# Patient Record
Sex: Female | Born: 1985
Health system: Southern US, Community
[De-identification: ages and names within clinical notes are randomized; demographics above are authoritative.]

## PROBLEM LIST (undated history)

## (undated) ENCOUNTER — Ambulatory Visit: Discharge status: Absent without leave | Payer: BC Managed Care – PPO

## (undated) DIAGNOSIS — T7840XA Allergy, unspecified, initial encounter: Secondary | ICD-10-CM

## (undated) DIAGNOSIS — F99 Mental disorder, not otherwise specified: Secondary | ICD-10-CM

## (undated) DIAGNOSIS — F410 Panic disorder [episodic paroxysmal anxiety] without agoraphobia: Secondary | ICD-10-CM

## (undated) DIAGNOSIS — J45990 Exercise induced bronchospasm: Secondary | ICD-10-CM

## (undated) DIAGNOSIS — F419 Anxiety disorder, unspecified: Secondary | ICD-10-CM

## (undated) HISTORY — DX: Allergy, unspecified, initial encounter: T78.40XA

## (undated) HISTORY — DX: Mental disorder, not otherwise specified: F99

## (undated) HISTORY — DX: Anxiety disorder, unspecified: F41.9

## (undated) HISTORY — DX: Panic disorder (episodic paroxysmal anxiety): F41.0

## (undated) HISTORY — DX: Exercise induced bronchospasm: J45.990

---
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
## 2003-10-26 IMAGING — CT CT PELVIS W/O CM
1 series · 15 of 32 positions shown, 19 images · non-contrast
Comparison: none

FINDINGS
CLINICAL DATA: LEFT LOWER QUADRANT ABDOMINAL PAIN, NAUSEA.
CT ABDOMEN WITHOUT CONTRAST MEDIA
NO PRIOR STUDIES.
CONTIGUOUS AXIAL CT IMAGES WERE OBTAINED FROM THE ADRENAL GLANDS THROUGH THE ILIAC CRESTS.  NO
CONTRAST WAS ADMINISTERED.
LEFT-SIDED HYDRONEPHROSIS AND HYDROURETER EXTEND DOWN TO THE LEVEL OF AN OBSTRUCTIVE  3 MM LEFT
URETEROVESICAL JUNCTION CALCULUS.  MILD LEFT PERINEPHRIC AND PERIURETERAL STRANDING IS PRESENT.
THERE IS A 2 MM LEFT KIDNEY UPPER POLE NON-OBSTRUCTIVE CALCULUS, A 3 MM LEFT MID KIDNEY NON-
OBSTRUCTIVE CALCULUS, AND A 2 MM RIGHT MID KIDNEY NON-OBSTRUCTIVE CALCULUS.  NO RIGHT-SIDED
HYDRONEPHROSIS OR HYDROURETER IS PRESENT.  INCIDENTAL NOTE IS MADE OF A CIRCUMAORTIC LEFT RENAL
VEIN.
IMPRESSION
1.  LEFT SIDED RENAL OBSTRUCTION DUE TO LEFT URETEROVESICAL JUNCTION CALCULUS.
2.  NON-OBSTRUCTIVE BILATERAL RENAL CALCULI.
CT PELVIS WITHOUT CONTRAST
CONTIGUOUS AXIAL CT IMAGES WERE OBTAINED FROM THE ILIAC CRESTS TO THE PROXIMAL FEMURS.
LEFT HYDRONEPHROSIS EXTENDS DOWN TO A 4 MM LEFT URETEROVESICAL JUNCTION CALCULUS, WHICH IS
OBSTRUCTIVE.  NO FREE PELVIC FLUID IS IDENTIFIED.
OBSTRUCTIVE 3 MM LEFT UVJ STONE.

[Series 807: — · axial · 0.52mm/px · z∈[+1295,+1620]mm · 15 of 73 slices shown, 19 images]
[im 5/73  soft-tissue]
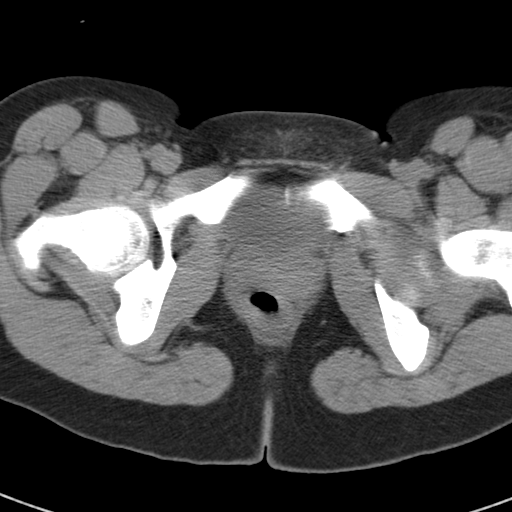
[im 5/73  bone]
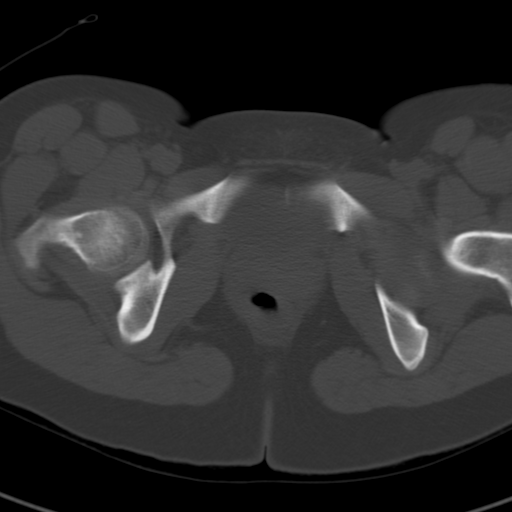
[im 10/73  soft-tissue]
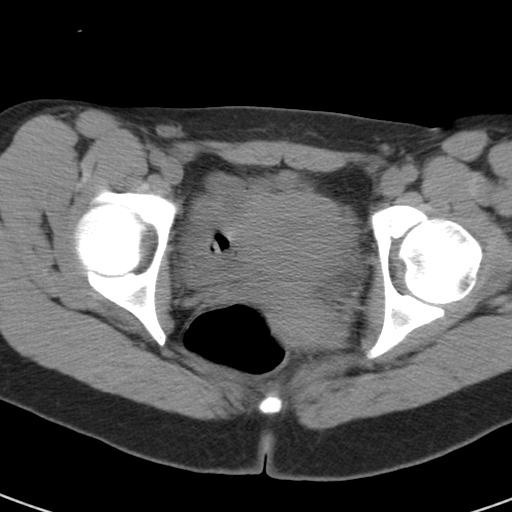
[im 14/73  soft-tissue]
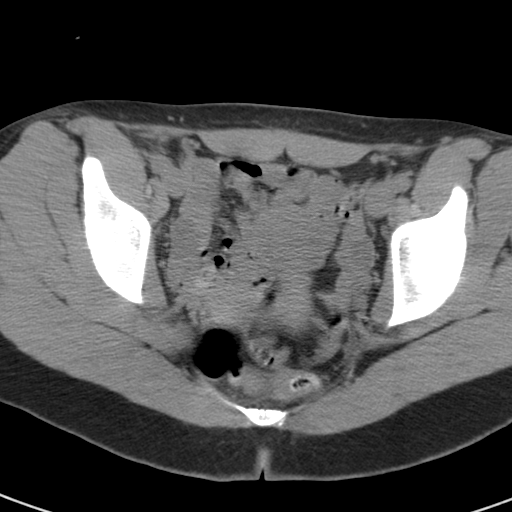
[im 21/73  soft-tissue]
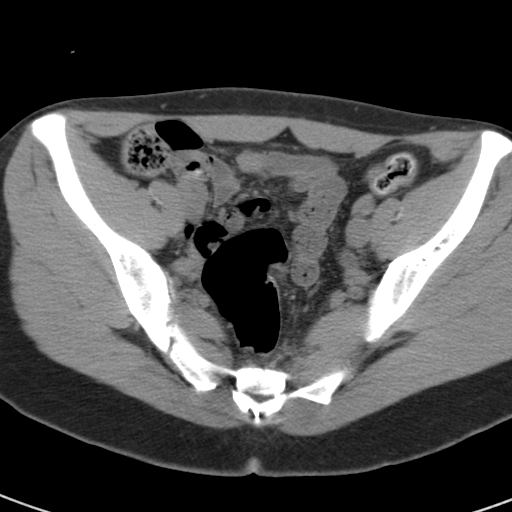
[im 26/73  soft-tissue]
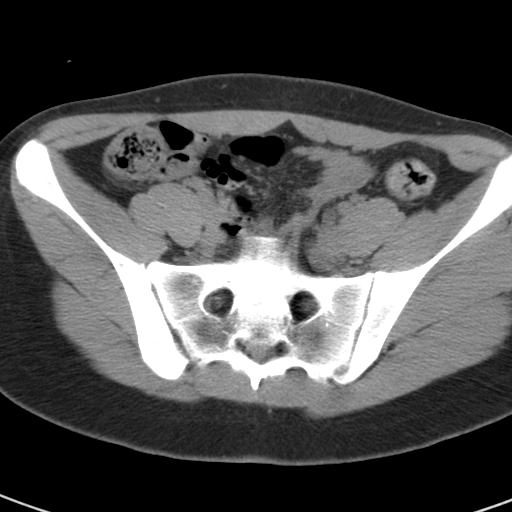
[im 31/73  soft-tissue]
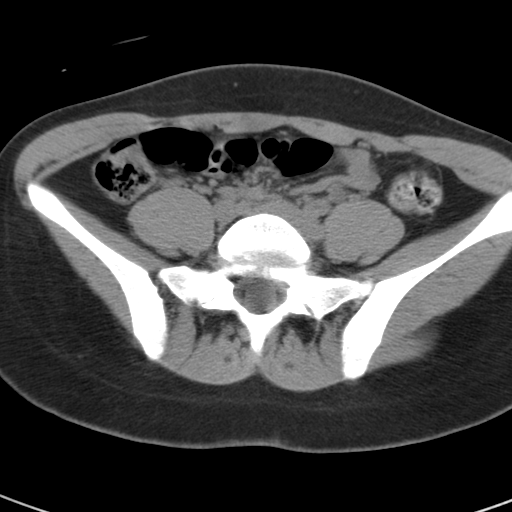
[im 38/73  soft-tissue]
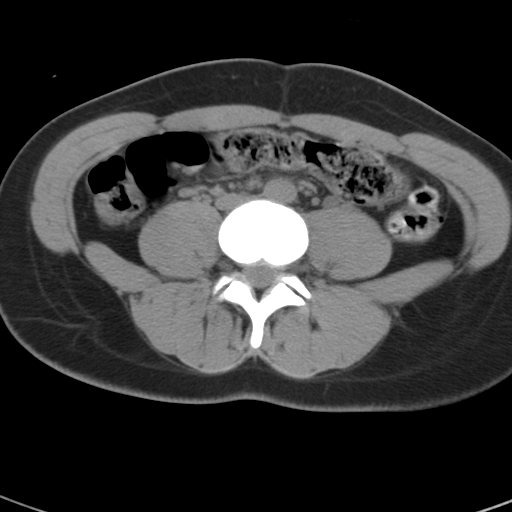
[im 42/73  soft-tissue]
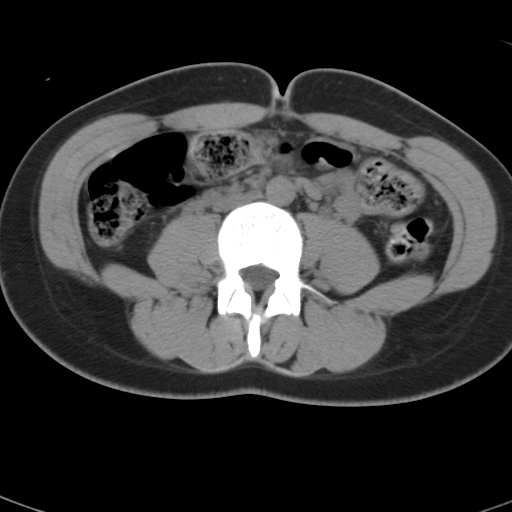
[im 47/73  soft-tissue]
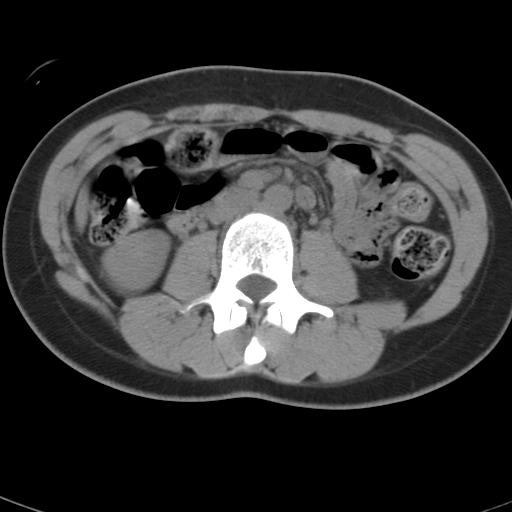
[im 47/73  bone]
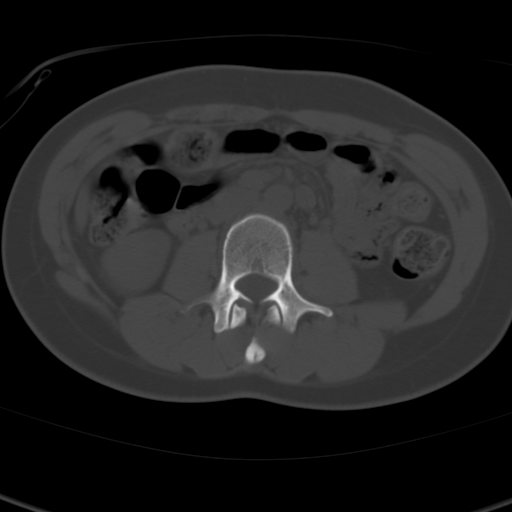
[im 52/73  soft-tissue]
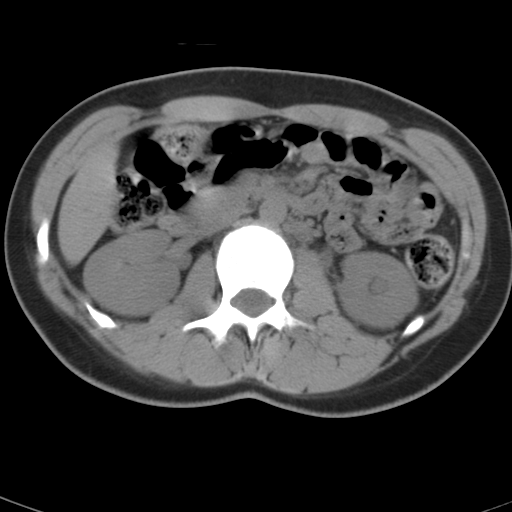
[im 59/73  soft-tissue]
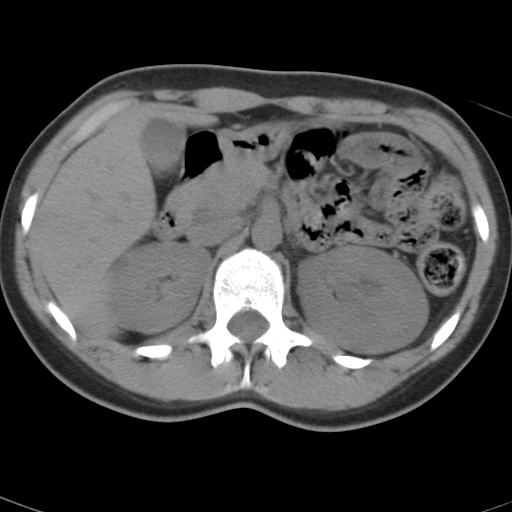
[im 63/73  soft-tissue]
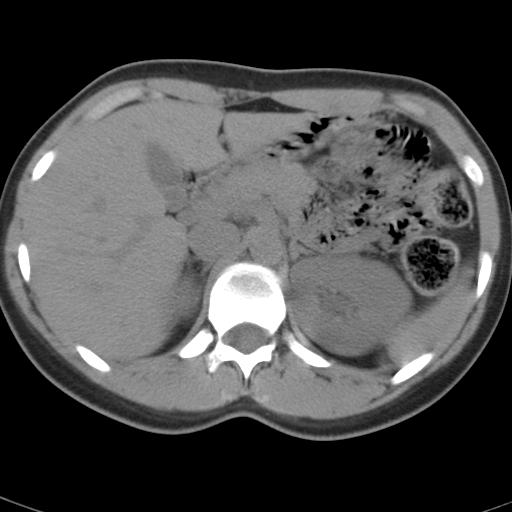
[im 63/73  lung]
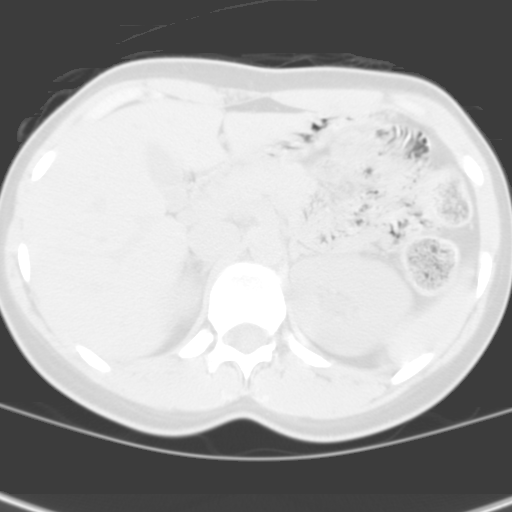
[im 66/73  lung]
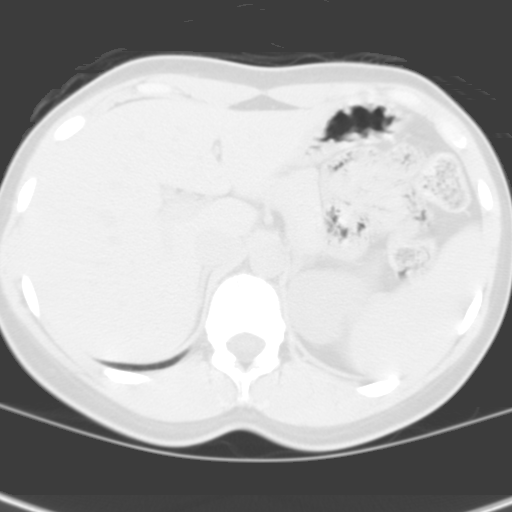
[im 68/73  soft-tissue]
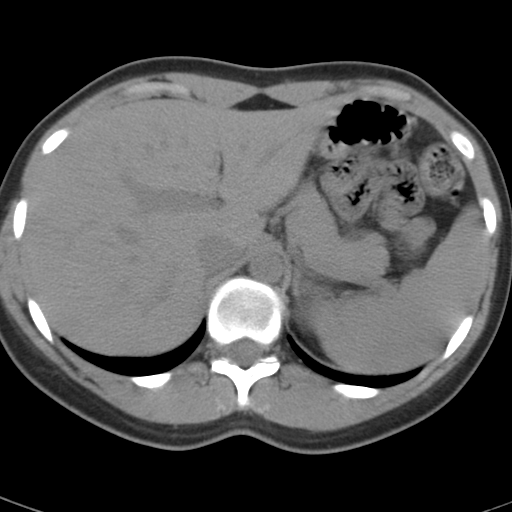
[im 68/73  lung]
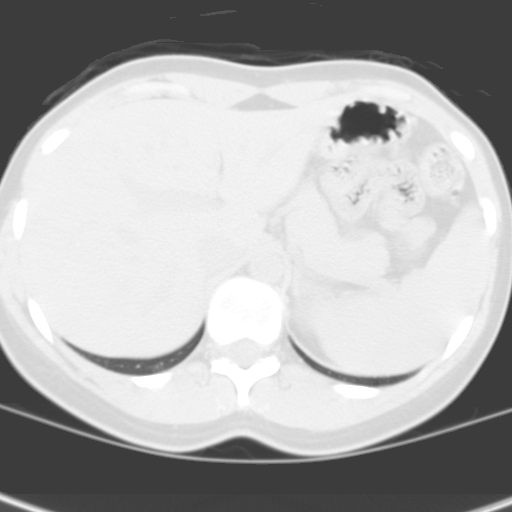
[im 70/73  lung]
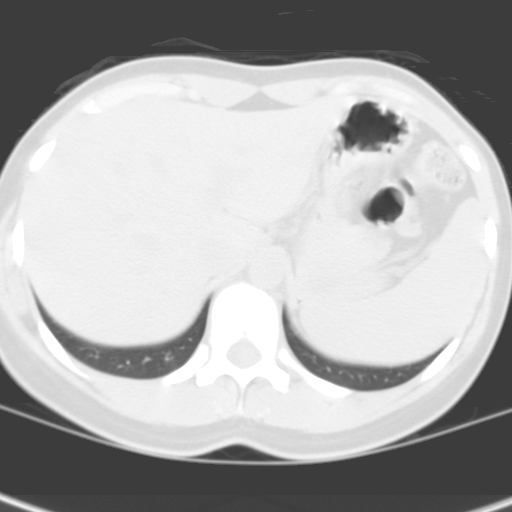

[15 of 32 positions shown; findings below may reference images not displayed]

## 2019-02-06 ENCOUNTER — Encounter: Payer: Self-pay | Admitting: *Deleted

## 2019-02-06 ENCOUNTER — Telehealth: Payer: Self-pay | Admitting: *Deleted

## 2019-02-06 NOTE — Telephone Encounter (Signed)
Pt requesting nuvaring refill in advance of her new patient appt. Discussed with Jenn patient can come Friday at 1 pm. Patient agreed but not sure she wants pap visit Friday, kept upcoming appt and pt will let us know on Friday if she wants to go ahead and do pap/physical same day.

## 2019-02-07 ENCOUNTER — Telehealth: Payer: Self-pay | Admitting: Adult Health

## 2019-02-07 NOTE — Telephone Encounter (Signed)
Tried to reach the patient to remind her of her appointment/restrictions, call can not be completed at this time. °

## 2019-02-08 ENCOUNTER — Ambulatory Visit (INDEPENDENT_AMBULATORY_CARE_PROVIDER_SITE_OTHER): Payer: BC Managed Care – PPO | Admitting: Adult Health

## 2019-02-08 ENCOUNTER — Other Ambulatory Visit: Payer: Self-pay

## 2019-02-08 ENCOUNTER — Other Ambulatory Visit (HOSPITAL_COMMUNITY)
Admission: RE | Admit: 2019-02-08 | Discharge: 2019-02-08 | Disposition: A | Discharge status: Home / Self Care | Payer: BC Managed Care – PPO | Source: Ambulatory Visit | Attending: Adult Health | Admitting: Adult Health

## 2019-02-08 ENCOUNTER — Encounter: Payer: Self-pay | Admitting: Adult Health

## 2019-02-08 VITALS — BP 125/85 | Ht 65.5 in | Wt 142.0 lb

## 2019-02-08 DIAGNOSIS — Z01419 Encounter for gynecological examination (general) (routine) without abnormal findings: Secondary | ICD-10-CM

## 2019-02-08 DIAGNOSIS — Z113 Encounter for screening for infections with a predominantly sexual mode of transmission: Secondary | ICD-10-CM | POA: Insufficient documentation

## 2019-02-08 DIAGNOSIS — Z30015 Encounter for initial prescription of vaginal ring hormonal contraceptive: Secondary | ICD-10-CM | POA: Insufficient documentation

## 2019-02-08 MED ORDER — ETONOGESTREL-ETHINYL ESTRADIOL 0.12-0.015 MG/24HR VA RING: 1.0000 | VAGINAL_RING | VAGINAL | 4 refills | Status: DC

## 2019-02-08 NOTE — Progress Notes (Signed)
Patient ID: Laurie Young, female   DOB: [birth year], 33 y.o.   MRN: [id number] History of Present Illness:  Laurie Young is a 33 year old white female, single,G0P0, in for a new pt well woman gyn exam,she had normal pap with negative HPV in 2018 in [place]. She has moved back from [place] and needs refill on nuva ring.  PCP is Dr Gerarda Fraction.  Current Medications, Allergies, Past Medical History, Past Surgical History, Family History and Social History were reviewed in [facility] record.     Review of Systems: Patient denies any headaches, hearing loss, fatigue, blurred vision, shortness of breath, chest pain, abdominal pain, problems with bowel movements, urination, or intercourse. No joint pain or mood swings.    Physical Exam:BP 125/85 (BP Location: Left Arm, Patient Position: Sitting, Cuff Size: Normal)   Ht 5' 5.5" (1.664 m)   Wt 142 lb (64.4 kg)   LMP 01/10/2019 (Exact Date)   BMI 23.27 kg/m  General:  Well developed, well nourished, no acute distress Skin:  Warm and dry Neck:  Midline trachea, normal thyroid, good ROM, no lymphadenopathy Lungs; Clear to auscultation bilaterally Breast:  No dominant palpable mass, retraction, or nipple discharge Cardiovascular: Regular rate and rhythm Abdomen:  Soft, non tender, no hepatosplenomegaly Pelvic:  External genitalia is normal in appearance, no lesions.  The vagina is normal in appearance.+blood in vault. Urethra has no lesions or masses. The cervix is nulliparous, GC/CHL obtained.  Uterus is felt to be normal size, shape, and contour.  No adnexal masses or tenderness noted.Bladder is non tender, no masses felt. Extremities/musculoskeletal:  No swelling or varicosities noted, no clubbing or cyanosis Psych:  No mood changes, alert and cooperative,seems happy Fall risk is low PHQ 2 score is 0 Examination chaperoned by Rolena Infante LPN.   Impression and Plan:  1. Encounter for well woman exam with routine gynecological  exam -pap and physical in 1 year  2. Screening examination for STD (sexually transmitted disease) Check GC/CHL Declines HIV and RPR  3. Encounter for initial prescription of vaginal ring hormonal contraceptive Meds ordered this encounter  Medications  . etonogestrel-ethinyl estradiol (NUVARING) 0.12-0.015 MG/24HR vaginal ring    Sig: Place 1 each vaginally every 28 (twenty-eight) days. Insert vaginally and leave in place for 3 consecutive weeks, then remove for 1 week.    Dispense:  3 each    Refill:  4    Order Specific Question:   Supervising Provider    Answer:   Tania Ade H [[id number]]

## 2019-02-13 ENCOUNTER — Other Ambulatory Visit: Payer: Self-pay | Admitting: Adult Health

## 2019-02-18 LAB — CERVICOVAGINAL ANCILLARY ONLY
Chlamydia: NEGATIVE
Comment: NEGATIVE
Comment: NORMAL
Neisseria Gonorrhea: NEGATIVE

## 2019-05-09 DIAGNOSIS — J309 Allergic rhinitis, unspecified: Secondary | ICD-10-CM | POA: Diagnosis not present

## 2019-05-09 DIAGNOSIS — Z6824 Body mass index (BMI) 24.0-24.9, adult: Secondary | ICD-10-CM | POA: Diagnosis not present

## 2019-05-09 DIAGNOSIS — Z0001 Encounter for general adult medical examination with abnormal findings: Secondary | ICD-10-CM | POA: Diagnosis not present

## 2019-05-09 DIAGNOSIS — J45909 Unspecified asthma, uncomplicated: Secondary | ICD-10-CM | POA: Diagnosis not present

## 2019-07-23 ENCOUNTER — Other Ambulatory Visit (HOSPITAL_COMMUNITY): Payer: Self-pay | Admitting: Allergy and Immunology

## 2019-07-23 DIAGNOSIS — J4541 Moderate persistent asthma with (acute) exacerbation: Secondary | ICD-10-CM | POA: Diagnosis not present

## 2019-07-23 DIAGNOSIS — J3089 Other allergic rhinitis: Secondary | ICD-10-CM | POA: Diagnosis not present

## 2019-07-23 DIAGNOSIS — H1045 Other chronic allergic conjunctivitis: Secondary | ICD-10-CM | POA: Diagnosis not present

## 2019-12-21 ENCOUNTER — Ambulatory Visit: Payer: Self-pay

## 2019-12-31 MED FILL — FLUoxetine HCL 10 MG CAPS: 10 | 90 days supply | Qty: 90 | Fill #0

## 2019-12-31 MED FILL — LEVOCETIRIZINE 5 MG TABLET: 5 | 90 days supply | Qty: 90 | Fill #0

## 2020-01-13 MED FILL — ETONOGESTREL-ETHINYL ESTRAD: 0.12-0.015 | 84 days supply | Qty: 3 | Fill #0

## 2020-02-27 ENCOUNTER — Ambulatory Visit: Payer: BC Managed Care – PPO | Attending: Internal Medicine

## 2020-02-27 DIAGNOSIS — Z23 Encounter for immunization: Secondary | ICD-10-CM

## 2020-02-27 NOTE — Progress Notes (Signed)
   Covid-19 Vaccination Clinic  Name:  MAMIE HUNDERTMARK    MRN: [id number] DOB: 09/26/1985  02/27/2020  Ms. Dawson was observed post Covid-19 immunization for 15 minutes without incident. She was provided with Vaccine Information Sheet and instruction to access the V-Safe system.   Ms. Nazaryan was instructed to call 911 with any severe reactions post vaccine: Marland Kitchen Difficulty breathing  . Swelling of face and throat  . A fast heartbeat  . A bad rash all over body  . Dizziness and weakness

## 2020-04-21 ENCOUNTER — Other Ambulatory Visit: Payer: Self-pay | Admitting: Obstetrics & Gynecology

## 2020-04-22 ENCOUNTER — Other Ambulatory Visit (HOSPITAL_COMMUNITY): Payer: Self-pay | Admitting: Allergy and Immunology

## 2020-04-22 MED FILL — SYMBICORT 160-4.5 MCG INH: 160-4.5 | 30 days supply | Qty: 10 | Fill #0

## 2020-04-22 MED FILL — ETONOGESTREL-ETHINYL ESTRAD: 0.12-0.015 | 84 days supply | Qty: 3 | Fill #0

## 2020-07-02 MED FILL — LEVOCETIRIZINE 5 MG TABLET: 5 | 30 days supply | Qty: 30 | Fill #1

## 2020-07-03 ENCOUNTER — Other Ambulatory Visit (HOSPITAL_COMMUNITY): Payer: Self-pay | Admitting: Internal Medicine

## 2020-07-03 MED FILL — FLUoxetine HCL 10 MG CAPS: 10 | 30 days supply | Qty: 30 | Fill #0

## 2020-07-27 MED FILL — ETONOGESTREL-ETHINYL ESTRAD: 0.12-0.015 | 84 days supply | Qty: 3 | Fill #1

## 2020-08-25 ENCOUNTER — Other Ambulatory Visit (HOSPITAL_COMMUNITY): Payer: Self-pay | Admitting: Allergy and Immunology

## 2020-08-25 ENCOUNTER — Other Ambulatory Visit (HOSPITAL_COMMUNITY): Payer: Self-pay

## 2020-08-25 MED FILL — Budesonide-Formoterol Fumarate Dihyd Aerosol 160-4.5 MCG/ACT: RESPIRATORY_TRACT | 30 days supply | Qty: 10.2 | Fill #0 | Status: CN

## 2020-08-28 ENCOUNTER — Other Ambulatory Visit (HOSPITAL_COMMUNITY): Payer: Self-pay

## 2020-08-28 MED ORDER — FLUOXETINE HCL 10 MG PO CAPS
10.0000 mg | ORAL_CAPSULE | Freq: Every day | ORAL | 0 refills | Status: DC
  Filled 2020-08-28: qty 30, 30d supply, fill #0

## 2020-09-02 ENCOUNTER — Other Ambulatory Visit (HOSPITAL_COMMUNITY): Payer: Self-pay

## 2020-09-03 ENCOUNTER — Other Ambulatory Visit (HOSPITAL_COMMUNITY): Payer: Self-pay

## 2020-09-03 MED FILL — Budesonide-Formoterol Fumarate Dihyd Aerosol 160-4.5 MCG/ACT: RESPIRATORY_TRACT | 30 days supply | Qty: 10.2 | Fill #0 | Status: AC

## 2020-09-04 ENCOUNTER — Other Ambulatory Visit (HOSPITAL_COMMUNITY): Payer: Self-pay

## 2020-09-16 ENCOUNTER — Ambulatory Visit: Payer: BC Managed Care – PPO | Attending: Critical Care Medicine

## 2020-09-16 DIAGNOSIS — Z20822 Contact with and (suspected) exposure to covid-19: Secondary | ICD-10-CM | POA: Diagnosis not present

## 2020-09-17 LAB — SARS-COV-2, NAA 2 DAY TAT

## 2020-09-17 LAB — NOVEL CORONAVIRUS, NAA: SARS-CoV-2, NAA: NOT DETECTED

## 2020-10-12 ENCOUNTER — Other Ambulatory Visit (HOSPITAL_COMMUNITY): Payer: Self-pay

## 2020-10-12 DIAGNOSIS — M21619 Bunion of unspecified foot: Secondary | ICD-10-CM | POA: Diagnosis not present

## 2020-10-12 DIAGNOSIS — M779 Enthesopathy, unspecified: Secondary | ICD-10-CM | POA: Diagnosis not present

## 2020-10-12 DIAGNOSIS — Z1331 Encounter for screening for depression: Secondary | ICD-10-CM | POA: Diagnosis not present

## 2020-10-12 DIAGNOSIS — Z1389 Encounter for screening for other disorder: Secondary | ICD-10-CM | POA: Diagnosis not present

## 2020-10-12 DIAGNOSIS — G5603 Carpal tunnel syndrome, bilateral upper limbs: Secondary | ICD-10-CM | POA: Diagnosis not present

## 2020-10-12 DIAGNOSIS — F419 Anxiety disorder, unspecified: Secondary | ICD-10-CM | POA: Diagnosis not present

## 2020-10-12 DIAGNOSIS — Z0001 Encounter for general adult medical examination with abnormal findings: Secondary | ICD-10-CM | POA: Diagnosis not present

## 2020-10-12 DIAGNOSIS — Z6823 Body mass index (BMI) 23.0-23.9, adult: Secondary | ICD-10-CM | POA: Diagnosis not present

## 2020-10-12 MED ORDER — FLUOXETINE HCL 10 MG PO CAPS
10.0000 mg | ORAL_CAPSULE | Freq: Every day | ORAL | 3 refills | Status: DC
  Filled 2020-10-12 – 2020-10-20 (×2): qty 90, 90d supply, fill #0

## 2020-10-13 DIAGNOSIS — D510 Vitamin B12 deficiency anemia due to intrinsic factor deficiency: Secondary | ICD-10-CM | POA: Diagnosis not present

## 2020-10-20 ENCOUNTER — Other Ambulatory Visit (HOSPITAL_COMMUNITY): Payer: Self-pay | Admitting: Allergy and Immunology

## 2020-10-20 ENCOUNTER — Other Ambulatory Visit (HOSPITAL_COMMUNITY): Payer: Self-pay

## 2020-10-20 MED FILL — Etonogestrel-Ethinyl Estradiol VA Ring 0.12-0.015 MG/24HR: VAGINAL | 84 days supply | Qty: 3 | Fill #0 | Status: AC

## 2020-10-21 ENCOUNTER — Other Ambulatory Visit (HOSPITAL_COMMUNITY): Payer: Self-pay

## 2020-10-22 ENCOUNTER — Encounter: Payer: Self-pay | Admitting: Podiatry

## 2020-10-22 ENCOUNTER — Other Ambulatory Visit: Payer: Self-pay

## 2020-10-22 ENCOUNTER — Other Ambulatory Visit (HOSPITAL_COMMUNITY): Payer: Self-pay

## 2020-10-22 ENCOUNTER — Ambulatory Visit (INDEPENDENT_AMBULATORY_CARE_PROVIDER_SITE_OTHER): Payer: 59 | Admitting: Podiatry

## 2020-10-22 ENCOUNTER — Ambulatory Visit: Payer: 59

## 2020-10-22 DIAGNOSIS — M2011 Hallux valgus (acquired), right foot: Secondary | ICD-10-CM

## 2020-10-22 DIAGNOSIS — M21611 Bunion of right foot: Secondary | ICD-10-CM | POA: Diagnosis not present

## 2020-10-22 DIAGNOSIS — M21612 Bunion of left foot: Secondary | ICD-10-CM

## 2020-10-26 ENCOUNTER — Encounter: Payer: Self-pay | Admitting: Podiatry

## 2020-10-26 NOTE — Progress Notes (Signed)
  Subjective:  Patient ID: Laurie Young, female    DOB: 1986/04/19,  MRN: [id number]  Chief Complaint  Patient presents with   Bunions    PT states she has right bunion pain. Pt states that she wore some high heels and has been having pain since.     35 y.o. female presents with the above complaint. History confirmed with patient.  The left foot is not bothersome for her.  Causes pain and a burning sensation over the bump when there is pressure on it.  Its worse in shoes.  Has intermittent numbness in the second and third toes as well.  Objective:  Physical Exam: warm, good capillary refill, no trophic changes or ulcerative lesions, normal DP and PT pulses, and normal sensory exam.  Right Foot: She has hallux valgus deformity with bunion formation on the medial metatarsal head.  Full smooth and pain-free range of motion of the MTPJ.  Negative grind test.  Radiographs: Multiple views x-ray of the right foot: Moderate hallux valgus deformity with no evidence of osteoarthritis of the first metatarsal head Assessment:   1. Hallux valgus with bunions, right      Plan:  Patient was evaluated and treated and all questions answered.  Discussed the etiology and treatment including surgical and non surgical treatment for painful bunions.  We discussed how this relates to for sure mechanics and overall foot shape.  Spicule increasing bothersome for her.  We discussed nonsurgical treatment with wider shoes, silicone padding and offloading of the area.  Does not bother her as much in the summer when she is in open shoes more often.  We discussed surgical correction as well.  I think in her case should be a good candidate for minimally invasive approach with a distal metatarsal osteotomy and screw fixation with possible Akin.  She will consider her options and return if she is interested in surgical correction   Return if symptoms worsen or fail to improve; or when ready to schedule bunion  surgery.

## 2020-11-06 ENCOUNTER — Other Ambulatory Visit: Payer: Self-pay

## 2020-11-06 ENCOUNTER — Ambulatory Visit (HOSPITAL_COMMUNITY)
Admission: EM | Admit: 2020-11-06 | Discharge: 2020-11-06 | Disposition: A | Discharge status: Home / Self Care | Payer: 59 | Attending: Physician Assistant | Admitting: Physician Assistant

## 2020-11-06 ENCOUNTER — Other Ambulatory Visit (HOSPITAL_COMMUNITY): Payer: Self-pay

## 2020-11-06 ENCOUNTER — Encounter (HOSPITAL_COMMUNITY): Payer: Self-pay

## 2020-11-06 DIAGNOSIS — W5503XA Scratched by cat, initial encounter: Secondary | ICD-10-CM | POA: Diagnosis not present

## 2020-11-06 DIAGNOSIS — S60511A Abrasion of right hand, initial encounter: Secondary | ICD-10-CM

## 2020-11-06 MED ORDER — METRONIDAZOLE 500 MG PO TABS
500.0000 mg | ORAL_TABLET | Freq: Three times a day (TID) | ORAL | 0 refills | Status: AC
  Filled 2020-11-06: qty 21, 7d supply, fill #0

## 2020-11-06 MED ORDER — SULFAMETHOXAZOLE-TRIMETHOPRIM 800-160 MG PO TABS
1.0000 | ORAL_TABLET | Freq: Two times a day (BID) | ORAL | 0 refills | Status: DC
  Filled 2020-11-06: qty 20, 10d supply, fill #0

## 2020-11-06 NOTE — Discharge Instructions (Addendum)
Return here tomorrow to be seen for evaluation.  Soak area 20 minutes 4 times a day. Elevate.

## 2020-11-06 NOTE — ED Triage Notes (Signed)
Pt presents with small animal puncture from her cat that scratched her last night; pt states she has an allergy to cats.  Hand is red, tender, and swollen.

## 2020-11-06 NOTE — ED Provider Notes (Signed)
[facility]    CSN: [id number] Arrival date & time: 11/06/20  0932      History   Chief Complaint Chief Complaint  Patient presents with   Animal Scratch    HPI Laurie Young is a 35 y.o. female.   Pt reports her cat scratched her right hand.  Pt reports area is swollen around her hand.    The history is provided by the patient. No language interpreter was used.  Hand Injury Location:  Hand Hand location:  R hand Injury: yes   Pain details:    Quality:  Aching   Radiates to:  Does not radiate   Severity:  Mild   Onset quality:  Sudden   Duration:  1 day   Timing:  Constant Relieved by:  Nothing Worsened by:  Nothing Ineffective treatments:  None tried Associated symptoms: no fever    Past Medical History:  Diagnosis Date   Allergies    Anxiety    Mental disorder    Panic attacks     Patient Active Problem List   Diagnosis Date Noted   Screening examination for STD (sexually transmitted disease) 02/08/2019   Encounter for well woman exam with routine gynecological exam 02/08/2019   Encounter for initial prescription of vaginal ring hormonal contraceptive 02/08/2019    History reviewed. No pertinent surgical history.  OB History     Gravida  0   Para  0   Term  0   Preterm  0   AB  0   Living  0      SAB  0   IAB  0   Ectopic  0   Multiple  0   Live Births  0            Home Medications    Prior to Admission medications   Medication Sig Start Date End Date Taking? Authorizing Provider  metroNIDAZOLE (FLAGYL) 500 MG tablet Take 1 tablet (500 mg total) by mouth 3 (three) times daily for 7 days. 11/06/20 11/13/20 Yes Cheron Schaumann K, PA-C  sulfamethoxazole-trimethoprim (BACTRIM DS) 800-160 MG tablet Take 1 tablet by mouth 2 (two) times daily. 11/06/20  Yes Cheron Schaumann K, PA-C  ALPRAZolam Prudy Feeler) 0.25 MG tablet Take 0.25 mg by mouth as needed for anxiety.    [provider]  budesonide-formoterol (SYMBICORT)  160-4.5 MCG/ACT inhaler Inhale 2 puffs into the lungs 2 (two) times daily.    [provider]  budesonide-formoterol (SYMBICORT) 160-4.5 MCG/ACT inhaler INHALE 2 PUFFS INTO THE LUNGS TWICE A DAY 04/22/20 04/22/21  Irena Cords, Enzo Montgomery, MD  etonogestrel-ethinyl estradiol (NUVARING) 0.12-0.015 MG/24HR vaginal ring INSERT 1 RING VAGINALLY FOR 3 WEEKS AND REMOVE FOR 1 WEEK 04/21/20 04/21/21  Lazaro Arms, MD  FLUoxetine (PROZAC) 10 MG capsule TAKE 1 CAPSULE BY MOUTH ONCE DAILY. 07/03/20 07/03/21  Elfredia Nevins, MD  FLUoxetine (PROZAC) 10 MG capsule Take 1 capsule (10 mg total) by mouth daily. 10/12/20     FLUoxetine (PROZAC) 10 MG tablet Take 10 mg by mouth daily.    [provider]  levocetirizine (XYZAL) 5 MG tablet TAKE 1 TABLET BY MOUTH ONCE DAILY EVERY EVENING 07/23/19 07/22/20  Irena Cords, Enzo Montgomery, MD  montelukast (SINGULAIR) 10 MG tablet Take 10 mg by mouth at bedtime.    [provider]    Family History Family History  Problem Relation Age of Onset   Stroke Mother    Anxiety disorder Mother    Depression Mother  Social History Social History   Tobacco Use   Smoking status: Never   Smokeless tobacco: Never  Vaping Use   Vaping Use: Never used  Substance Use Topics   Alcohol use: Yes    Comment: socially   Drug use: Never     Allergies   Penicillins and Other   Review of Systems Review of Systems  Constitutional:  Negative for fever.  All other systems reviewed and are negative.   Physical Exam Triage Vital Signs ED Triage Vitals  Enc Vitals Group     BP 11/06/20 1013 133/84     Pulse Rate 11/06/20 1013 67     Resp 11/06/20 1013 18     Temp 11/06/20 1013 98.3 F (36.8 C)     Temp Source 11/06/20 1013 Oral     SpO2 11/06/20 1013 99 %     Weight --      Height --      Head Circumference --      Peak Flow --      Pain Score 11/06/20 1011 4     Pain Loc --      Pain Edu? --      Excl. in GC? --    No data found.  Updated  Vital Signs BP 133/84 (BP Location: Right Arm)   Pulse 67   Temp 98.3 F (36.8 C) (Oral)   Resp 18   LMP 10/14/2020   SpO2 99%   Visual Acuity Right Eye Distance:   Left Eye Distance:   Bilateral Distance:    Right Eye Near:   Left Eye Near:    Bilateral Near:     Physical Exam Vitals reviewed.  HENT:     Head: Normocephalic.  Musculoskeletal:        General: Swelling and tenderness present.     Comments: Small abrasion dorsal right hand,  erythematous  tender  Skin:    General: Skin is warm.  Neurological:     General: No focal deficit present.     Mental Status: She is alert.  Psychiatric:        Mood and Affect: Mood normal.     UC Treatments / Results  Labs (all labs ordered are listed, but only abnormal results are displayed) Labs Reviewed - No data to display  EKG   Radiology No results found.  Procedures Procedures (including critical care time)  Medications Ordered in UC Medications - No data to display  Initial Impression / Assessment and Plan / UC Course  I have reviewed the triage vital signs and the nursing notes.  Pertinent labs & imaging results that were available during my care of the patient were reviewed by me and considered in my medical decision making (see chart for details).     MDM:  Pt counseled on hand infection.  Pt given rx for bactrim and flagyl.  Pt is allergic to pcn. Pt advised to recheck here tomorrow.  Final Clinical Impressions(s) / UC Diagnoses   Final diagnoses:  None     Discharge Instructions      Return here tomorrow to be seen for evaluation.  Soak area 20 minutes 4 times a day. Elevate.    ED Prescriptions     Medication Sig Dispense Auth. Provider   sulfamethoxazole-trimethoprim (BACTRIM DS) 800-160 MG tablet Take 1 tablet by mouth 2 (two) times daily. 20 tablet Maple Odaniel K, PA-C   metroNIDAZOLE (FLAGYL) 500 MG tablet Take 1 tablet (500 mg total) by mouth  3 (three) times daily for 7 days. 21  tablet Elson Areas, [place]      An After Visit Summary was printed and given to the patient.  PDMP not reviewed this encounter.   Elson Areas, [place] 11/06/20 1111

## 2020-11-11 ENCOUNTER — Other Ambulatory Visit (HOSPITAL_COMMUNITY): Payer: Self-pay

## 2020-11-11 DIAGNOSIS — M79671 Pain in right foot: Secondary | ICD-10-CM | POA: Diagnosis not present

## 2020-11-11 DIAGNOSIS — M21611 Bunion of right foot: Secondary | ICD-10-CM | POA: Diagnosis not present

## 2020-11-11 MED ORDER — CARESTART COVID-19 HOME TEST VI KIT
PACK | 0 refills | Status: DC
Start: 2020-11-11 — End: 2021-03-04
  Filled 2020-11-11: qty 4, 4d supply, fill #0

## 2020-11-12 DIAGNOSIS — M25561 Pain in right knee: Secondary | ICD-10-CM | POA: Diagnosis not present

## 2021-01-13 ENCOUNTER — Other Ambulatory Visit (HOSPITAL_COMMUNITY): Payer: Self-pay

## 2021-01-13 MED FILL — Etonogestrel-Ethinyl Estradiol VA Ring 0.12-0.015 MG/24HR: VAGINAL | 84 days supply | Qty: 3 | Fill #1 | Status: AC

## 2021-02-13 ENCOUNTER — Telehealth: Payer: Self-pay | Admitting: Emergency Medicine

## 2021-02-13 DIAGNOSIS — U071 COVID-19: Secondary | ICD-10-CM

## 2021-02-13 DIAGNOSIS — J069 Acute upper respiratory infection, unspecified: Secondary | ICD-10-CM

## 2021-02-13 MED ORDER — NIRMATRELVIR/RITONAVIR (PAXLOVID)TABLET
3.0000 | ORAL_TABLET | Freq: Two times a day (BID) | ORAL | 0 refills | Status: AC
  Filled 2021-02-13: qty 30, 5d supply, fill #0

## 2021-02-13 MED ORDER — PREDNISONE 10 MG PO TABS
ORAL_TABLET | Freq: Every day | ORAL | 0 refills | Status: DC
  Filled 2021-02-13: qty 42, 12d supply, fill #0

## 2021-02-13 NOTE — Patient Instructions (Signed)
[place], thank you for joining [place], PA-C for today's virtual visit.  While this provider is not your primary care provider (PCP), if your PCP is located in our provider database this encounter information will be shared with them immediately following your visit.  Consent: (Patient) [place] provided verbal consent for this virtual visit at the beginning of the encounter.  Current Medications:  Current Outpatient Medications:    nirmatrelvir/ritonavir EUA (PAXLOVID) 20 x 150 MG & 10 x 100MG TABS, Take 3 tablets by mouth 2 (two) times daily for 5 days. (Take nirmatrelvir 150 mg two tablets twice daily for 5 days and ritonavir 100 mg one tablet twice daily for 5 days) Patient GFR is wnl per patient, Disp: 30 tablet, Rfl: 0   predniSONE (STERAPRED UNI-PAK 21 TAB) 10 MG (21) TBPK tablet, Take by mouth daily. Take 6 tabs by mouth daily  for 2 days, then 5 tabs for 2 days, then 4 tabs for 2 days, then 3 tabs for 2 days, 2 tabs for 2 days, then 1 tab by mouth daily for 2 days, Disp: 42 tablet, Rfl: 0   ALPRAZolam (XANAX) 0.25 MG tablet, Take 0.25 mg by mouth as needed for anxiety., Disp: , Rfl:    budesonide-formoterol (SYMBICORT) 160-4.5 MCG/ACT inhaler, Inhale 2 puffs into the lungs 2 (two) times daily., Disp: , Rfl:    budesonide-formoterol (SYMBICORT) 160-4.5 MCG/ACT inhaler, INHALE 2 PUFFS INTO THE LUNGS TWICE A DAY, Disp: 10.2 g, Rfl: 1   COVID-19 At Home Antigen Test (CARESTART COVID-19 HOME TEST) KIT, use as directed, Disp: 4 each, Rfl: 0   FLUoxetine (PROZAC) 10 MG capsule, TAKE 1 CAPSULE BY MOUTH ONCE DAILY., Disp: 30 capsule, Rfl: 0   FLUoxetine (PROZAC) 10 MG capsule, Take 1 capsule (10 mg total) by mouth daily., Disp: 90 capsule, Rfl: 3   FLUoxetine (PROZAC) 10 MG tablet, Take 10 mg by mouth daily., Disp: , Rfl:    levocetirizine (XYZAL) 5 MG tablet, TAKE 1 TABLET BY MOUTH ONCE DAILY EVERY EVENING, Disp: 120 tablet, Rfl: 0   montelukast (SINGULAIR) 10 MG tablet,  Take 10 mg by mouth at bedtime., Disp: , Rfl:    sulfamethoxazole-trimethoprim (BACTRIM DS) 800-160 MG tablet, Take 1 tablet by mouth 2 (two) times daily., Disp: 20 tablet, Rfl: 0   Medications ordered in this encounter:  Meds ordered this encounter  Medications   nirmatrelvir/ritonavir EUA (PAXLOVID) 20 x 150 MG & 10 x 100MG TABS    Sig: Take 3 tablets by mouth 2 (two) times daily for 5 days. (Take nirmatrelvir 150 mg two tablets twice daily for 5 days and ritonavir 100 mg one tablet twice daily for 5 days) Patient GFR is wnl per patient    Dispense:  30 tablet    Refill:  0    Order Specific Question:   Supervising Provider    Answer:   Sabra Heck, BRIAN [[id number]]   predniSONE (STERAPRED UNI-PAK 21 TAB) 10 MG (21) TBPK tablet    Sig: Take by mouth daily. Take 6 tabs by mouth daily  for 2 days, then 5 tabs for 2 days, then 4 tabs for 2 days, then 3 tabs for 2 days, 2 tabs for 2 days, then 1 tab by mouth daily for 2 days    Dispense:  42 tablet    Refill:  0    Order Specific Question:   Supervising Provider    Answer:   Noemi Chapel [[id number]]     *If you  need refills on other medications prior to your next appointment, please contact your pharmacy*  Follow-Up: Call back or seek an in-person evaluation if the symptoms worsen or if the condition fails to improve as anticipated.  Other Instructions COVID test was positive You should remain isolated in your home for 5 days from symptom onset AND greater than 72 hours after symptoms resolution (absence of fever without the use of fever-reducing medication and improvement in respiratory symptoms), whichever is longer Get plenty of rest and push fluids Paxlovid prescribed.  Patient reports blood work done at Dr. Gerarda Fraction office was within normal limits.  Denies hx of kidney or liver dysfunction Prednisone prescribed for possible asthma flare.  Use zyrtec for nasal congestion, runny nose, and/or sore throat Use flonase for nasal congestion and runny  nose Use medications daily for symptom relief Use OTC medications like ibuprofen or tylenol as needed fever or pain Follow up with PCP in 1-2 days via phone or e-visit for recheck and to ensure symptoms are improving Call or go to the ED if you have any new or worsening symptoms such as fever, worsening cough, shortness of breath, chest tightness, chest pain, turning blue, changes in mental status, etc...     If you have been instructed to have an in-person evaluation today at a local Urgent Care facility, please use the link below. It will take you to a list of all of our available [facility], including address, phone number and hours of operation. Please do not delay care.  [facility]  If you or a family member do not have a primary care provider, use the link below to schedule a visit and establish care. When you choose a [facility] primary care physician or advanced practice provider, you gain a long-term partner in health. Find a Primary Care Provider  Learn more about [facility]'s in-office and virtual care options: [facility]

## 2021-02-13 NOTE — Progress Notes (Signed)
Virtual Visit Consent   [place], you are scheduled for a virtual visit with a [facility] provider today.     Just as with appointments in the office, your consent must be obtained to participate.  Your consent will be active for this visit and any virtual visit you may have with one of our providers in the next 365 days.     If you have a MyChart account, a copy of this consent can be sent to you electronically.  All virtual visits are billed to your insurance company just like a traditional visit in the office.    As this is a virtual visit, video technology does not allow for your provider to perform a traditional examination.  This may limit your provider's ability to fully assess your condition.  If your provider identifies any concerns that need to be evaluated in person or the need to arrange testing (such as labs, EKG, etc.), we will make arrangements to do so.     Although advances in technology are sophisticated, we cannot ensure that it will always work on either your end or our end.  If the connection with a video visit is poor, the visit may have to be switched to a telephone visit.  With either a video or telephone visit, we are not always able to ensure that we have a secure connection.     I need to obtain your verbal consent now.   Are you willing to proceed with your visit today?    [place] has provided verbal consent on 02/13/2021 for a virtual visit (video or telephone).   [name], [place]   Date: 02/13/2021 10:29 AM   Virtual Visit via Video Note   I, [name], connected with  [place]  ([id number], [birth year]) on 02/13/21 at 10:15 AM EDT by a video-enabled telemedicine application and verified that I am speaking with the correct person using two identifiers.  Location: Patient: Virtual Visit Location Patient: Home Provider: Virtual Visit Location Provider: Home Office   I discussed the limitations of evaluation and management by  telemedicine and the availability of in person appointments. The patient expressed understanding and agreed to proceed.    History of Present Illness: Laurie Young is a 35 y.o. who identifies as a female who was assigned female at birth, and is being seen today for headache, chills, body aches, fever, tmax of 102, wheezing, some chest congestion, x 2 days.  Denies sick exposure to COVID, flu or strep.  Has tried night time mucinex without relief.  Symptoms are made worse at night.  Reports/ denies previous symptoms in the past.   Denies SOB, chest pain, nausea, changes in bowel or bladder habits.    ROS: As per HPI.  All other pertinent ROS negative.      HPI: HPI  Problems:  Patient Active Problem List   Diagnosis Date Noted   Screening examination for STD (sexually transmitted disease) 02/08/2019   Encounter for well woman exam with routine gynecological exam 02/08/2019   Encounter for initial prescription of vaginal ring hormonal contraceptive 02/08/2019    Allergies:  Allergies  Allergen Reactions   Penicillins    Other     CATS   Medications:  Current Outpatient Medications:    nirmatrelvir/ritonavir EUA (PAXLOVID) 20 x 150 MG & 10 x 100MG TABS, Take 3 tablets by mouth 2 (two) times daily for 5 days. (Take nirmatrelvir 150 mg two tablets twice daily for 5  days and ritonavir 100 mg one tablet twice daily for 5 days) Patient GFR is wnl per patient, Disp: 30 tablet, Rfl: 0   predniSONE (STERAPRED UNI-PAK 21 TAB) 10 MG (21) TBPK tablet, Take by mouth daily. Take 6 tabs by mouth daily  for 2 days, then 5 tabs for 2 days, then 4 tabs for 2 days, then 3 tabs for 2 days, 2 tabs for 2 days, then 1 tab by mouth daily for 2 days, Disp: 42 tablet, Rfl: 0   ALPRAZolam (XANAX) 0.25 MG tablet, Take 0.25 mg by mouth as needed for anxiety., Disp: , Rfl:    budesonide-formoterol (SYMBICORT) 160-4.5 MCG/ACT inhaler, Inhale 2 puffs into the lungs 2 (two) times daily., Disp: , Rfl:     budesonide-formoterol (SYMBICORT) 160-4.5 MCG/ACT inhaler, INHALE 2 PUFFS INTO THE LUNGS TWICE A DAY, Disp: 10.2 g, Rfl: 1   COVID-19 At Home Antigen Test (CARESTART COVID-19 HOME TEST) KIT, use as directed, Disp: 4 each, Rfl: 0   FLUoxetine (PROZAC) 10 MG capsule, TAKE 1 CAPSULE BY MOUTH ONCE DAILY., Disp: 30 capsule, Rfl: 0   FLUoxetine (PROZAC) 10 MG capsule, Take 1 capsule (10 mg total) by mouth daily., Disp: 90 capsule, Rfl: 3   FLUoxetine (PROZAC) 10 MG tablet, Take 10 mg by mouth daily., Disp: , Rfl:    levocetirizine (XYZAL) 5 MG tablet, TAKE 1 TABLET BY MOUTH ONCE DAILY EVERY EVENING, Disp: 120 tablet, Rfl: 0   montelukast (SINGULAIR) 10 MG tablet, Take 10 mg by mouth at bedtime., Disp: , Rfl:    sulfamethoxazole-trimethoprim (BACTRIM DS) 800-160 MG tablet, Take 1 tablet by mouth 2 (two) times daily., Disp: 20 tablet, Rfl: 0  Observations/Objective: Patient is well-developed, well-nourished in no acute distress.  Resting comfortably  at home.  Head is normocephalic, atraumatic.  No labored breathing.  Speech is clear and coherent with logical content.  Patient is alert and oriented at baseline.    Assessment and Plan: 1. Viral URI with cough  2. COVID-19 virus infection COVID test was positive You should remain isolated in your home for 5 days from symptom onset AND greater than 72 hours after symptoms resolution (absence of fever without the use of fever-reducing medication and improvement in respiratory symptoms), whichever is longer Get plenty of rest and push fluids Paxlovid prescribed.  Patient reports blood work done at Dr. Gerarda Fraction office was within normal limits.  Denies hx of kidney or liver dysfunction Prednisone prescribed for possible asthma flare.  Use zyrtec for nasal congestion, runny nose, and/or sore throat Use flonase for nasal congestion and runny nose Use medications daily for symptom relief Use OTC medications like ibuprofen or tylenol as needed fever or  pain Follow up with PCP in 1-2 days via phone or e-visit for recheck and to ensure symptoms are improving Call or go to the ED if you have any new or worsening symptoms such as fever, worsening cough, shortness of breath, chest tightness, chest pain, turning blue, changes in mental status, etc...    Follow Up Instructions: I discussed the assessment and treatment plan with the patient. The patient was provided an opportunity to ask questions and all were answered. The patient agreed with the plan and demonstrated an understanding of the instructions.  A copy of instructions were sent to the patient via MyChart unless otherwise noted below.    The patient was advised to call back or seek an in-person evaluation if the symptoms worsen or if the condition fails to improve as anticipated.  Time:  I spent 10 minutes with the patient via telehealth technology discussing the above problems/concerns.    [name], PA-C

## 2021-02-15 ENCOUNTER — Other Ambulatory Visit (HOSPITAL_COMMUNITY): Payer: Self-pay

## 2021-02-15 MED ORDER — METHYLPREDNISOLONE 4 MG PO TBPK
ORAL_TABLET | ORAL | 0 refills | Status: DC
  Filled 2021-02-15: qty 21, 6d supply, fill #0

## 2021-02-15 MED ORDER — AZITHROMYCIN 250 MG PO TABS
ORAL_TABLET | ORAL | 0 refills | Status: DC
  Filled 2021-02-15: qty 6, 5d supply, fill #0

## 2021-03-04 ENCOUNTER — Other Ambulatory Visit: Payer: Self-pay

## 2021-03-04 ENCOUNTER — Other Ambulatory Visit (HOSPITAL_COMMUNITY): Payer: Self-pay

## 2021-03-04 ENCOUNTER — Encounter (HOSPITAL_BASED_OUTPATIENT_CLINIC_OR_DEPARTMENT_OTHER): Payer: Self-pay | Admitting: Obstetrics & Gynecology

## 2021-03-04 ENCOUNTER — Ambulatory Visit (INDEPENDENT_AMBULATORY_CARE_PROVIDER_SITE_OTHER): Payer: BC Managed Care – PPO | Admitting: Obstetrics & Gynecology

## 2021-03-04 ENCOUNTER — Other Ambulatory Visit (HOSPITAL_COMMUNITY)
Admission: RE | Admit: 2021-03-04 | Discharge: 2021-03-04 | Disposition: A | Discharge status: Home / Self Care | Payer: BC Managed Care – PPO | Source: Ambulatory Visit | Attending: Obstetrics & Gynecology | Admitting: Obstetrics & Gynecology

## 2021-03-04 VITALS — BP 135/82 | HR 86 | Ht 65.0 in | Wt 147.0 lb

## 2021-03-04 DIAGNOSIS — Z124 Encounter for screening for malignant neoplasm of cervix: Secondary | ICD-10-CM | POA: Diagnosis not present

## 2021-03-04 DIAGNOSIS — Z01419 Encounter for gynecological examination (general) (routine) without abnormal findings: Secondary | ICD-10-CM

## 2021-03-04 DIAGNOSIS — J45990 Exercise induced bronchospasm: Secondary | ICD-10-CM | POA: Diagnosis not present

## 2021-03-04 DIAGNOSIS — Z3141 Encounter for fertility testing: Secondary | ICD-10-CM

## 2021-03-04 DIAGNOSIS — Z113 Encounter for screening for infections with a predominantly sexual mode of transmission: Secondary | ICD-10-CM

## 2021-03-04 DIAGNOSIS — Z3044 Encounter for surveillance of vaginal ring hormonal contraceptive device: Secondary | ICD-10-CM

## 2021-03-04 MED ORDER — BUDESONIDE-FORMOTEROL FUMARATE 160-4.5 MCG/ACT IN AERO
2.0000 | INHALATION_SPRAY | Freq: Two times a day (BID) | RESPIRATORY_TRACT | 1 refills | Status: DC
  Filled 2021-03-04: qty 10.2, 30d supply, fill #0

## 2021-03-04 MED ORDER — ETONOGESTREL-ETHINYL ESTRADIOL 0.12-0.015 MG/24HR VA RING
1.0000 | VAGINAL_RING | VAGINAL | 3 refills | Status: DC
  Filled 2021-03-04 – 2021-05-03 (×2): qty 3, 84d supply, fill #0

## 2021-03-04 NOTE — Progress Notes (Addendum)
35 y.o. G0P0000 [name] here for annual exam.  She has been seen at [facility] once in the past.  Lives closer to [place] location.  Uses Nuva ring for contraception and cycle control.  Cycles are 3 days and light.    Broke up with boyfriend last week.  Needs STD testing.  Does have some questions about fertility.  Possible testing discussed.  Also, costs are a concern.  Will see if can get that information.  Lastly, egg freezing discussed.       Sexually active: No.  The current method of family planning is OCP (estrogen/progesterone).    Exercising: Yes.    Smoker:  no  Health Maintenance: Pap:  obtained today History of abnormal Pap:  no MMG:  guidelines reviewed Colonoscopy:  guidelines reviewed    reports that she has never smoked. She has never used smokeless tobacco. She reports current alcohol use. She reports that she does not use drugs.  Past Medical History:  Diagnosis Date   Allergies    Anxiety    Mental disorder    Panic attacks     History reviewed. No pertinent surgical history.  Current Outpatient Medications  Medication Sig Dispense Refill   ALPRAZolam (XANAX) 0.25 MG tablet Take 0.25 mg by mouth as needed for anxiety.     FLUoxetine (PROZAC) 10 MG capsule Take 1 capsule (10 mg total) by mouth daily. 90 capsule 3   budesonide-formoterol (SYMBICORT) 160-4.5 MCG/ACT inhaler Inhale 2 puffs into the lungs 2 (two) times daily. 10.2 g 1   etonogestrel-ethinyl estradiol (NUVARING) 0.12-0.015 MG/24HR vaginal ring Place 1 ring vaginally every 28 (twenty-eight) days. Insert vaginally and leave in place for 3 consecutive weeks, then remove for 1 week. 3 each 3   levocetirizine (XYZAL) 5 MG tablet TAKE 1 TABLET BY MOUTH ONCE DAILY EVERY EVENING 120 tablet 0   No current facility-administered medications for this visit.    Family History  Problem Relation Age of Onset   Stroke Mother    Anxiety disorder Mother    Depression  Mother     Review of Systems  All other systems reviewed and are negative.  Exam:   BP 135/82 (BP Location: Left Arm, Patient Position: Sitting, Cuff Size: Normal)   Pulse 86   Ht 5\' 5"  (1.651 m) Comment: reported  Wt 147 lb (66.7 kg)   LMP 03/03/2021   BMI 24.46 kg/m   Height: 5\' 5"  (165.1 cm) (reported)  General appearance: alert, cooperative and appears stated age Head: Normocephalic, without obvious abnormality, atraumatic Neck: no adenopathy, supple, symmetrical, trachea midline and thyroid normal to inspection and palpation Lungs: clear to auscultation bilaterally Breasts: normal appearance, no masses or tenderness Heart: regular rate and rhythm Abdomen: soft, non-tender; bowel sounds normal; no masses,  no organomegaly Extremities: extremities normal, atraumatic, no cyanosis or edema Skin: Skin color, texture, turgor normal. No rashes or lesions Lymph nodes: Cervical, supraclavicular, and axillary nodes normal. No abnormal inguinal nodes palpated Neurologic: Grossly normal   Pelvic: External genitalia:  no lesions              Urethra:  normal appearing urethra with no masses, tenderness or lesions              Bartholins and Skenes: normal                 Vagina: normal appearing vagina with normal color and no discharge, no lesions  Cervix: no lesions              Pap taken: Yes.   Bimanual Exam:  Uterus:  normal size, contour, position, consistency, mobility, non-tender              Adnexa: normal adnexa and no mass, fullness, tenderness               Rectovaginal: Confirms               Anus:  normal sphincter tone, no lesions  Chaperone, Ina Homes, CMA, was present for exam.  Assessment/Plan: 1. Well woman exam with routine gynecological exam - pap smear obtained today - mammogram and colonoscopy screening guidelines reviewed - vaccinations reviewed/updated, care gaps reviewed/updated  2. Cervical cancer screening - Cytology - PAP( [facility])  3. Screen for STD (sexually transmitted disease) - GC/Chl testing obtained today - RPR+HBsAg+HIV - Hepatitis C antibody  4. Fertility testing - discussed possible AMH testing.  Will see if can get cost information  5. Encounter for surveillance of vaginal ring hormonal contraceptive device - etonogestrel-ethinyl estradiol (NUVARING) 0.12-0.015 MG/24HR vaginal ring; Place 1 ring vaginally every 28 (twenty-eight) days. Insert vaginally and leave in place for 3 consecutive weeks, then remove for 1 week.  Dispense: 3 each; Refill: 3  6. Exercise-induced asthma - budesonide-formoterol (SYMBICORT) 160-4.5 MCG/ACT inhaler; Inhale 2 puffs into the lungs 2 (two) times daily.  Dispense: 10.2 g; Refill: 1

## 2021-03-05 ENCOUNTER — Other Ambulatory Visit (HOSPITAL_COMMUNITY): Payer: Self-pay

## 2021-03-05 LAB — RPR+HBSAG+HIV
HIV Screen 4th Generation wRfx: NONREACTIVE
Hepatitis B Surface Ag: NEGATIVE
RPR Ser Ql: NONREACTIVE

## 2021-03-05 LAB — HEPATITIS C ANTIBODY: Hep C Virus Ab: <0.1 s/co ratio (ref 0.0–0.9)

## 2021-03-07 ENCOUNTER — Encounter (HOSPITAL_BASED_OUTPATIENT_CLINIC_OR_DEPARTMENT_OTHER): Payer: Self-pay | Admitting: Obstetrics & Gynecology

## 2021-03-07 DIAGNOSIS — J45990 Exercise induced bronchospasm: Secondary | ICD-10-CM | POA: Insufficient documentation

## 2021-03-12 LAB — CYTOLOGY - PAP
Chlamydia: NEGATIVE
Comment: NEGATIVE
Comment: NEGATIVE
Comment: NEGATIVE
Comment: NORMAL
Diagnosis: UNDETERMINED — AB
High risk HPV: NEGATIVE
Neisseria Gonorrhea: NEGATIVE
Trichomonas: NEGATIVE

## 2021-04-05 DIAGNOSIS — Z79899 Other long term (current) drug therapy: Secondary | ICD-10-CM | POA: Diagnosis not present

## 2021-04-05 DIAGNOSIS — L700 Acne vulgaris: Secondary | ICD-10-CM | POA: Diagnosis not present

## 2021-05-03 ENCOUNTER — Other Ambulatory Visit (HOSPITAL_COMMUNITY): Payer: Self-pay

## 2021-05-06 DIAGNOSIS — Z79899 Other long term (current) drug therapy: Secondary | ICD-10-CM | POA: Diagnosis not present

## 2021-05-06 DIAGNOSIS — L700 Acne vulgaris: Secondary | ICD-10-CM | POA: Diagnosis not present

## 2021-05-06 DIAGNOSIS — Z23 Encounter for immunization: Secondary | ICD-10-CM | POA: Diagnosis not present

## 2021-06-01 ENCOUNTER — Other Ambulatory Visit (HOSPITAL_COMMUNITY): Payer: Self-pay

## 2021-06-08 DIAGNOSIS — L309 Dermatitis, unspecified: Secondary | ICD-10-CM | POA: Diagnosis not present

## 2021-06-08 DIAGNOSIS — Z79899 Other long term (current) drug therapy: Secondary | ICD-10-CM | POA: Diagnosis not present

## 2021-06-08 DIAGNOSIS — L700 Acne vulgaris: Secondary | ICD-10-CM | POA: Diagnosis not present

## 2021-06-10 ENCOUNTER — Other Ambulatory Visit: Payer: Self-pay

## 2021-06-10 ENCOUNTER — Ambulatory Visit
Admission: RE | Admit: 2021-06-10 | Discharge: 2021-06-10 | Disposition: A | Discharge status: Home / Self Care | Payer: BC Managed Care – PPO | Source: Ambulatory Visit

## 2021-06-10 VITALS — BP 138/89 | HR 80 | Temp 98.5°F | Resp 20 | Ht 65.5 in | Wt 140.0 lb

## 2021-06-10 DIAGNOSIS — J45901 Unspecified asthma with (acute) exacerbation: Secondary | ICD-10-CM | POA: Diagnosis not present

## 2021-06-10 MED ORDER — DULERA 100-5 MCG/ACT IN AERO: 2.0000 | INHALATION_SPRAY | Freq: Two times a day (BID) | RESPIRATORY_TRACT | 0 refills | Status: DC

## 2021-06-10 MED ORDER — PREDNISONE 20 MG PO TABS: 40.0000 mg | ORAL_TABLET | Freq: Every day | ORAL | 0 refills | Status: AC

## 2021-06-10 NOTE — ED Triage Notes (Signed)
Patient c/o congestion, wheezing, productive cough, allergic asthma, chest tightness x 10 days.  Needs a refill on her inhaler.

## 2021-06-10 NOTE — ED Provider Notes (Signed)
[facility]    CSN: [id number] Arrival date & time: 06/10/21  1604      History   Chief Complaint Chief Complaint  Patient presents with   Appointment    Nasal Congestion    HPI Laurie Young is a 36 y.o. female.   Patient here today for evaluation of cough, wheezing, shortness of breath that has been ongoing for the last 10 days. She reports that she has had exacerbations of her asthma in the past and this feels similar. She has not had fever. She recently ran out of her maintenance inhaler, and albuterol has only been helping minimally.   The history is provided by the patient.   Past Medical History:  Diagnosis Date   Allergies    Anxiety    Exercise-induced asthma    Mental disorder    Panic attacks     Patient Active Problem List   Diagnosis Date Noted   Exercise-induced asthma 03/07/2021   Screening examination for STD (sexually transmitted disease) 02/08/2019   Encounter for well woman exam with routine gynecological exam 02/08/2019   Encounter for initial prescription of vaginal ring hormonal contraceptive 02/08/2019    History reviewed. No pertinent surgical history.  OB History     Gravida  0   Para  0   Term  0   Preterm  0   AB  0   Living  0      SAB  0   IAB  0   Ectopic  0   Multiple  0   Live Births  0            Home Medications    Prior to Admission medications   Medication Sig Start Date End Date Taking? Authorizing Provider  ALPRAZolam Prudy Feeler) 0.25 MG tablet Take 0.25 mg by mouth as needed for anxiety.   Yes [provider]  etonogestrel-ethinyl estradiol (NUVARING) 0.12-0.015 MG/24HR vaginal ring Place 1 ring vaginally every 28 (twenty-eight) days. Insert vaginally and leave in place for 3 consecutive weeks, then remove for 1 week. 03/04/21  Yes Jerene Bears, MD  FLUoxetine (PROZAC) 10 MG capsule Take 1 capsule (10 mg total) by mouth daily. 10/12/20  Yes   ISOtretinoin (ACCUTANE) 40 MG  capsule   07/08/21 Yes [provider]  mometasone-formoterol (DULERA) 100-5 MCG/ACT AERO Inhale 2 puffs into the lungs in the morning and at bedtime. 06/10/21  Yes Tomi Bamberger, PA-C  predniSONE (DELTASONE) 20 MG tablet Take 2 tablets (40 mg total) by mouth daily with breakfast for 5 days. 06/10/21 06/15/21 Yes Tomi Bamberger, PA-C  levocetirizine (XYZAL) 5 MG tablet TAKE 1 TABLET BY MOUTH ONCE DAILY EVERY EVENING 07/23/19 07/22/20  Irena Cords, Enzo Montgomery, MD    Family History Family History  Problem Relation Age of Onset   Stroke Mother    Anxiety disorder Mother    Depression Mother     Social History Social History   Tobacco Use   Smoking status: Never   Smokeless tobacco: Never  Vaping Use   Vaping Use: Never used  Substance Use Topics   Alcohol use: Yes    Comment: socially   Drug use: Never     Allergies   Penicillins and Other   Review of Systems Review of Systems  Constitutional:  Negative for chills and fever.  HENT:  Positive for congestion.   Eyes:  Negative for discharge and redness.  Respiratory:  Positive for cough, chest tightness, shortness  of breath and wheezing.     Physical Exam Triage Vital Signs ED Triage Vitals  Enc Vitals Group     BP 06/10/21 1631 138/89     Pulse Rate 06/10/21 1631 80     Resp 06/10/21 1631 20     Temp 06/10/21 1631 98.5 F (36.9 C)     Temp Source 06/10/21 1631 Oral     SpO2 06/10/21 1631 97 %     Weight 06/10/21 1632 140 lb (63.5 kg)     Height 06/10/21 1632 5' 5.5" (1.664 m)     Head Circumference --      Peak Flow --      Pain Score 06/10/21 1632 0     Pain Loc --      Pain Edu? --      Excl. in GC? --    No data found.  Updated Vital Signs BP 138/89 (BP Location: Right Arm)    Pulse 80    Temp 98.5 F (36.9 C) (Oral)    Resp 20    Ht 5' 5.5" (1.664 m)    Wt 140 lb (63.5 kg)    LMP 05/27/2021    SpO2 97%    BMI 22.94 kg/m      Physical Exam Vitals and nursing note reviewed.  Constitutional:       General: She is not in acute distress.    Appearance: Normal appearance. She is not ill-appearing.  HENT:     Head: Normocephalic and atraumatic.     Nose: Congestion present.     Mouth/Throat:     Mouth: Mucous membranes are moist.     Pharynx: No oropharyngeal exudate or posterior oropharyngeal erythema.  Eyes:     Conjunctiva/sclera: Conjunctivae normal.  Cardiovascular:     Rate and Rhythm: Normal rate and regular rhythm.     Heart sounds: Normal heart sounds. No murmur heard. Pulmonary:     Effort: Pulmonary effort is normal. No respiratory distress.     Breath sounds: Wheezing (diffuse) present. No rhonchi or rales.  Skin:    General: Skin is warm and dry.  Neurological:     Mental Status: She is alert.  Psychiatric:        Mood and Affect: Mood normal.        Thought Content: Thought content normal.     UC Treatments / Results  Labs (all labs ordered are listed, but only abnormal results are displayed) Labs Reviewed - No data to display  EKG   Radiology No results found.  Procedures Procedures (including critical care time)  Medications Ordered in UC Medications - No data to display  Initial Impression / Assessment and Plan / UC Course  I have reviewed the triage vital signs and the nursing notes.  Pertinent labs & imaging results that were available during my care of the patient were reviewed by me and considered in my medical decision making (see chart for details).   Will refill dulera and steroid burst prescribed. Recommended follow up if no improvement or if symptoms worsen in any way.   Final Clinical Impressions(s) / UC Diagnoses   Final diagnoses:  Asthma with acute exacerbation, unspecified asthma severity, unspecified whether persistent   Discharge Instructions   None    ED Prescriptions     Medication Sig Dispense Auth. Provider   mometasone-formoterol (DULERA) 100-5 MCG/ACT AERO Inhale 2 puffs into the lungs in the morning and at  bedtime. 1 each Tomi Bamberger, PA-C  predniSONE (DELTASONE) 20 MG tablet Take 2 tablets (40 mg total) by mouth daily with breakfast for 5 days. 10 tablet Tomi Bamberger, PA-C      PDMP not reviewed this encounter.   Tomi Bamberger, PA-C 06/10/21 1806

## 2021-07-09 DIAGNOSIS — L259 Unspecified contact dermatitis, unspecified cause: Secondary | ICD-10-CM | POA: Diagnosis not present

## 2021-07-09 DIAGNOSIS — Z79899 Other long term (current) drug therapy: Secondary | ICD-10-CM | POA: Diagnosis not present

## 2021-07-09 DIAGNOSIS — L853 Xerosis cutis: Secondary | ICD-10-CM | POA: Diagnosis not present

## 2021-07-09 DIAGNOSIS — L700 Acne vulgaris: Secondary | ICD-10-CM | POA: Diagnosis not present

## 2021-08-10 DIAGNOSIS — L853 Xerosis cutis: Secondary | ICD-10-CM | POA: Diagnosis not present

## 2021-08-10 DIAGNOSIS — L700 Acne vulgaris: Secondary | ICD-10-CM | POA: Diagnosis not present

## 2021-08-10 DIAGNOSIS — Z79899 Other long term (current) drug therapy: Secondary | ICD-10-CM | POA: Diagnosis not present

## 2021-09-10 DIAGNOSIS — L308 Other specified dermatitis: Secondary | ICD-10-CM | POA: Diagnosis not present

## 2021-09-10 DIAGNOSIS — L700 Acne vulgaris: Secondary | ICD-10-CM | POA: Diagnosis not present

## 2021-09-10 DIAGNOSIS — Z79899 Other long term (current) drug therapy: Secondary | ICD-10-CM | POA: Diagnosis not present

## 2021-10-13 DIAGNOSIS — Z79899 Other long term (current) drug therapy: Secondary | ICD-10-CM | POA: Diagnosis not present

## 2021-10-13 DIAGNOSIS — L700 Acne vulgaris: Secondary | ICD-10-CM | POA: Diagnosis not present

## 2021-11-16 DIAGNOSIS — L308 Other specified dermatitis: Secondary | ICD-10-CM | POA: Diagnosis not present

## 2021-11-16 DIAGNOSIS — Z79899 Other long term (current) drug therapy: Secondary | ICD-10-CM | POA: Diagnosis not present

## 2021-11-16 DIAGNOSIS — L700 Acne vulgaris: Secondary | ICD-10-CM | POA: Diagnosis not present

## 2021-11-16 DIAGNOSIS — K130 Diseases of lips: Secondary | ICD-10-CM | POA: Diagnosis not present

## 2021-11-26 DIAGNOSIS — L700 Acne vulgaris: Secondary | ICD-10-CM | POA: Diagnosis not present

## 2021-11-26 DIAGNOSIS — Z79899 Other long term (current) drug therapy: Secondary | ICD-10-CM | POA: Diagnosis not present

## 2021-12-28 DIAGNOSIS — Z79899 Other long term (current) drug therapy: Secondary | ICD-10-CM | POA: Diagnosis not present

## 2021-12-28 DIAGNOSIS — L700 Acne vulgaris: Secondary | ICD-10-CM | POA: Diagnosis not present

## 2022-01-21 DIAGNOSIS — Z23 Encounter for immunization: Secondary | ICD-10-CM | POA: Diagnosis not present

## 2022-01-21 DIAGNOSIS — D518 Other vitamin B12 deficiency anemias: Secondary | ICD-10-CM | POA: Diagnosis not present

## 2022-02-17 DIAGNOSIS — Z6823 Body mass index (BMI) 23.0-23.9, adult: Secondary | ICD-10-CM | POA: Diagnosis not present

## 2022-02-17 DIAGNOSIS — E538 Deficiency of other specified B group vitamins: Secondary | ICD-10-CM | POA: Diagnosis not present

## 2022-02-17 DIAGNOSIS — Z0001 Encounter for general adult medical examination with abnormal findings: Secondary | ICD-10-CM | POA: Diagnosis not present

## 2022-02-17 DIAGNOSIS — Z9229 Personal history of other drug therapy: Secondary | ICD-10-CM | POA: Diagnosis not present

## 2022-02-17 DIAGNOSIS — J45901 Unspecified asthma with (acute) exacerbation: Secondary | ICD-10-CM | POA: Diagnosis not present

## 2022-02-17 DIAGNOSIS — Z1331 Encounter for screening for depression: Secondary | ICD-10-CM | POA: Diagnosis not present

## 2022-02-17 DIAGNOSIS — J4520 Mild intermittent asthma, uncomplicated: Secondary | ICD-10-CM | POA: Diagnosis not present

## 2022-02-18 LAB — COMPREHENSIVE METABOLIC PANEL
Calcium: 9.2 (ref 8.7–10.7)
EGFR: 117

## 2022-02-18 LAB — BASIC METABOLIC PANEL: Glucose: 86

## 2022-03-03 ENCOUNTER — Ambulatory Visit (INDEPENDENT_AMBULATORY_CARE_PROVIDER_SITE_OTHER): Payer: BC Managed Care – PPO | Admitting: Obstetrics & Gynecology

## 2022-03-03 ENCOUNTER — Encounter (HOSPITAL_BASED_OUTPATIENT_CLINIC_OR_DEPARTMENT_OTHER): Payer: Self-pay | Admitting: Obstetrics & Gynecology

## 2022-03-03 VITALS — BP 122/87 | HR 69 | Ht 65.0 in | Wt 146.2 lb

## 2022-03-03 DIAGNOSIS — Z3044 Encounter for surveillance of vaginal ring hormonal contraceptive device: Secondary | ICD-10-CM | POA: Diagnosis not present

## 2022-03-03 DIAGNOSIS — J45990 Exercise induced bronchospasm: Secondary | ICD-10-CM

## 2022-03-03 DIAGNOSIS — Z23 Encounter for immunization: Secondary | ICD-10-CM

## 2022-03-03 DIAGNOSIS — Z01419 Encounter for gynecological examination (general) (routine) without abnormal findings: Secondary | ICD-10-CM | POA: Diagnosis not present

## 2022-03-03 MED ORDER — ETONOGESTREL-ETHINYL ESTRADIOL 0.12-0.015 MG/24HR VA RING: 1.0000 | VAGINAL_RING | VAGINAL | 3 refills | Status: DC

## 2022-03-03 NOTE — Progress Notes (Signed)
36 y.o. G0P0000 [name] here for annual exam.  Doing well.  Back with same significant other.  It is rocky again.    Cycles are regular.  On Nuva ring.  Needs RF.  Took Accutane this year.  This was her second time taking it.  Finished just a week or two ago.    Patient's last menstrual period was 02/04/2022 (approximate).          Sexually active: Yes.    The current method of family planning is NuvaRing vaginal inserts.    Smoker:  no  Health Maintenance: Pap:  03/04/2021 AS-CUS, neg HR HPV History of abnormal Pap:  no MMG:  guidelines reviewed Colonoscopy:  guidelines reviewed Screening Labs: done two weeks ago with Dr. Sherwood Gambler.  Pt reports this was normal except cholesterol/triglycerides were elevated.  This will be retested.     reports that she has never smoked. She has never used smokeless tobacco. She reports current alcohol use. She reports that she does not use drugs.  Past Medical History:  Diagnosis Date   Allergies    Anxiety    Exercise-induced asthma    Mental disorder    Panic attacks     No past surgical history on file.  Current Outpatient Medications  Medication Sig Dispense Refill   ALPRAZolam (XANAX) 0.25 MG tablet Take 0.25 mg by mouth as needed for anxiety.     budesonide-formoterol (SYMBICORT) 80-4.5 MCG/ACT inhaler Inhale 2 puffs into the lungs 2 (two) times daily.     FLUoxetine (PROZAC) 10 MG capsule Take 1 capsule (10 mg total) by mouth daily. 90 capsule 3   etonogestrel-ethinyl estradiol (NUVARING) 0.12-0.015 MG/24HR vaginal ring Place 1 ring vaginally every 28 (twenty-eight) days. Insert vaginally and leave in place for 3 consecutive weeks, then remove for 1 week. 3 each 3   No current facility-administered medications for this visit.    Family History  Problem Relation Age of Onset   Stroke Mother    Anxiety disorder Mother    Depression Mother     ROS: Constitutional: negative Genitourinary:negative  Exam:    BP 122/87 (BP Location: Left Arm, Patient Position: Sitting, Cuff Size: Large)   Pulse 69   Ht 5\' 5"  (1.651 m) Comment: Reported  Wt 146 lb 3.2 oz (66.3 kg)   LMP 02/04/2022 (Approximate)   BMI 24.33 kg/m   Height: 5\' 5"  (165.1 cm) (Reported)  General appearance: alert, cooperative and appears stated age Head: Normocephalic, without obvious abnormality, atraumatic Neck: no adenopathy, supple, symmetrical, trachea midline and thyroid normal to inspection and palpation Lungs: clear to auscultation bilaterally Breasts: normal appearance, no masses or tenderness Heart: regular rate and rhythm Abdomen: soft, non-tender; bowel sounds normal; no masses,  no organomegaly Extremities: extremities normal, atraumatic, no cyanosis or edema Skin: Skin color, texture, turgor normal. No rashes or lesions Lymph nodes: Cervical, supraclavicular, and axillary nodes normal. No abnormal inguinal nodes palpated Neurologic: Grossly normal   Pelvic: External genitalia:  no lesions              Urethra:  normal appearing urethra with no masses, tenderness or lesions              Bartholins and Skenes: normal                 Vagina: normal appearing vagina with normal color and no discharge, no lesions              Cervix: no lesions  Pap taken: No. Bimanual Exam:  Uterus:  normal size, contour, position, consistency, mobility, non-tender              Adnexa: normal adnexa and no mass, fullness, tenderness               Rectovaginal: Confirms               Anus:  normal sphincter tone, no lesions  Chaperone, Octaviano Batty, CMA, was present for exam.  Assessment/Plan: 1. Well woman exam with routine gynecological exam - Pap smear ASCUS with Neg HR HPV 2022 - Mammogram guidelines reviewed - lab work done with PCP - vaccines reviewed.  Will updated Tdap.  2. Encounter for surveillance of vaginal ring hormonal contraceptive device - etonogestrel-ethinyl estradiol (NUVARING) 0.12-0.015  MG/24HR vaginal ring; Place 1 ring vaginally every 28 (twenty-eight) days. Insert vaginally and leave in place for 3 consecutive weeks, then remove for 1 week.  Dispense: 3 each; Refill: 3  3. Exercise-induced asthma

## 2022-03-17 DIAGNOSIS — Z79899 Other long term (current) drug therapy: Secondary | ICD-10-CM | POA: Diagnosis not present

## 2022-03-17 DIAGNOSIS — L700 Acne vulgaris: Secondary | ICD-10-CM | POA: Diagnosis not present

## 2022-06-06 ENCOUNTER — Ambulatory Visit (INDEPENDENT_AMBULATORY_CARE_PROVIDER_SITE_OTHER): Payer: BC Managed Care – PPO | Admitting: Adult Health

## 2022-06-06 ENCOUNTER — Encounter: Payer: Self-pay | Admitting: Adult Health

## 2022-06-06 VITALS — BP 137/95 | HR 77 | Ht 65.0 in | Wt 140.0 lb

## 2022-06-06 DIAGNOSIS — F411 Generalized anxiety disorder: Secondary | ICD-10-CM | POA: Diagnosis not present

## 2022-06-06 MED ORDER — ALPRAZOLAM 0.25 MG PO TABS: 0.2500 mg | ORAL_TABLET | ORAL | 2 refills | Status: AC | PRN

## 2022-06-06 MED ORDER — FLUOXETINE HCL 20 MG PO CAPS: 20.0000 mg | ORAL_CAPSULE | Freq: Every day | ORAL | 2 refills | Status: DC

## 2022-06-06 NOTE — Progress Notes (Signed)
[facility] MD/PA/NP Initial Note  06/06/2022 10:57 AM Laurie Young  MRN:  [id number]  Chief Complaint:   HPI:  Patient seen today for initial psychiatric evaluation.   Describes mood today as "ok". Pleasant. Denies tearfulness. Mood symptoms - denies depression - "it doesn't feel like depression". Lacks motivation to do things - "making excuses not to do things". Has days she wakes up motivated and gets things done - "maybe every few weeks". Reports procrastination - "more so in home environment". Reports some worry, rumination, and over thinking. Reports anxiety. Stating "I feel it more so - having a hard time relaxing" - "constantly asking questions" - "trying to make things more efficient". Stating "I over analyze everything - and would like not too". Reports irritability at times - "when anxiety is worse". Reports situational stressors. Mother recently had 4 strokes - mother lives with 6 year old grandfather. Considering next steps for mother. Reports increased family stressors. Lacking interest and motivation. Currently taking Prozac 10mg  daily and Xanax 0.25mg  daily as needed, but is open to considering other options.  Energy levels lower - has a B12 deficiency. Active, does not have a regular exercise routine.   Enjoys some usual interests and activities. Lives with boyfriend of 3 years (has broken up 3 times). Mother and grandfather live together. Brother local. Sister in [place]. 2 step sisters. Spending time with family. Appetite adequate. Weight stable 140 to 145 pounds. Sleeps well most nights. Averages 4 hours then wakes up for and hour and a half, then goes back to sleeps 3 more hours. Focus and concentration stable. Completing tasks. Managing aspects of household. Works full-time - remotely. Denies SI or HI.  Denies AH or VH. Denies self harm. Denies substance use.  Previous medication trials:  Prozac, Xanax  Visit Diagnosis:    ICD-10-CM   1. Generalized anxiety disorder   F41.1       Past Psychiatric History: Denies psychiatric hospitalization.   Past Medical History:  Past Medical History:  Diagnosis Date   Allergies    Anxiety    Exercise-induced asthma    Mental disorder    Panic attacks    No past surgical history on file.  Family Psychiatric History: Denies any family history of mental illness.   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   Tobacco Use   Smoking status: Never   Smokeless tobacco: Never  Vaping Use   Vaping Use: Never used  Substance and Sexual Activity   Alcohol use: Yes    Comment: socially   Drug use: Never   Sexual activity: Yes    Birth control/protection: Inserts  Other Topics Concern   Not on file  Social History Narrative   Not on file   Social Determinants of Health   Financial Resource Strain: Not on file  Food Insecurity: Not on file  Transportation Needs: Not on file  Physical Activity: Not on file  Stress: Not on file  Social Connections: Not on file    Allergies:  Allergies  Allergen Reactions   Penicillins  Other     CATS    Metabolic Disorder Labs: No results found for: "HGBA1C", "MPG" No results found for: "PROLACTIN" No results found for: "CHOL", "TRIG", "HDL", "CHOLHDL", "VLDL", "LDLCALC" No results found for: "TSH"  Therapeutic Level Labs: No results found for: "LITHIUM" No results found for: "VALPROATE" No results found for: "CBMZ"  Current Medications: Current Outpatient Medications  Medication Sig Dispense Refill   ALPRAZolam (XANAX) 0.25 MG tablet Take 0.25 mg by mouth as needed for anxiety.      budesonide-formoterol (SYMBICORT) 80-4.5 MCG/ACT inhaler Inhale 2 puffs into the lungs 2 (two) times daily.     etonogestrel-ethinyl estradiol (NUVARING) 0.12-0.015 MG/24HR vaginal ring Place 1 ring vaginally every 28 (twenty-eight) days. Insert vaginally and leave in place for 3 consecutive weeks, then remove for 1 week. 3 each 3   FLUoxetine (PROZAC) 10 MG capsule Take 1 capsule (10 mg total) by mouth daily. 90 capsule 3   No current facility-administered medications for this visit.    Medication Side Effects: none  Orders placed this visit:  No orders of the defined types were placed in this encounter.   Psychiatric Specialty Exam:  Review of Systems  Musculoskeletal:  Negative for gait problem.  Neurological:  Negative for tremors.  Psychiatric/Behavioral:         Please refer to HPI    There were no vitals taken for this visit.There is no height or weight on file to calculate BMI.  General Appearance: Casual and Neat  Eye Contact:  Good  Speech:  Clear and Coherent and Normal Rate  Volume:  Normal  Mood:  Euthymic  Affect:  Appropriate and Congruent  Thought Process:  Coherent and Descriptions of Associations: Intact  Orientation:  Full (Time, Place, and Person)  Thought Content: Logical   Suicidal Thoughts:  No  Homicidal Thoughts:  No  Memory:  WNL  Judgement:  Good  Insight:  Good  Psychomotor Activity:  Normal  Concentration:  Concentration: Good and Attention Span: Good  Recall:  Good  Fund of Knowledge: Good  Language: Good  Assets:  Communication Skills Desire for Improvement Financial Resources/Insurance Housing Intimacy Leisure Time Physical Health Resilience Social Support Talents/Skills Transportation Vocational/Educational  ADL's:  Intact  Cognition: WNL  Prognosis:  Good   Screenings:  PHQ2-9    [place] Office Visit from 03/03/2022 in [facility] for [employer] at [place] Visit from 03/04/2021 in [facility] for [employer] at [place] Visit from 02/08/2019 in [facility] for [place] [facility]  PHQ-2 Total Score 0 0 0      [place] ED from 06/10/2021 in [facility] at [facility]) ED from 11/06/2020 in [facility] at [place] No Risk No Risk       Receiving Psychotherapy: No   Treatment Plan/Recommendations:  Plan:  PDMP reviewed  Increase Prozac 10mg  to 20mg  daily  Continue Xanax 0.25mg  daily prn   Discussed NAC tabs  RTC 4 weeks  Patient advised to contact office with any questions, adverse effects, or acute worsening in signs and symptoms.    Aloha Gell, NP

## 2022-06-09 DIAGNOSIS — F331 Major depressive disorder, recurrent, moderate: Secondary | ICD-10-CM | POA: Diagnosis not present

## 2022-06-09 DIAGNOSIS — F411 Generalized anxiety disorder: Secondary | ICD-10-CM | POA: Diagnosis not present

## 2022-06-15 DIAGNOSIS — E538 Deficiency of other specified B group vitamins: Secondary | ICD-10-CM | POA: Diagnosis not present

## 2022-06-25 ENCOUNTER — Ambulatory Visit
Admission: EM | Admit: 2022-06-25 | Discharge: 2022-06-25 | Disposition: A | Discharge status: Home / Self Care | Payer: BC Managed Care – PPO | Attending: Family Medicine | Admitting: Family Medicine

## 2022-06-25 DIAGNOSIS — J039 Acute tonsillitis, unspecified: Secondary | ICD-10-CM | POA: Diagnosis not present

## 2022-06-25 DIAGNOSIS — R52 Pain, unspecified: Secondary | ICD-10-CM | POA: Insufficient documentation

## 2022-06-25 DIAGNOSIS — U071 COVID-19: Secondary | ICD-10-CM | POA: Diagnosis not present

## 2022-06-25 LAB — POCT RAPID STREP A (OFFICE): Rapid Strep A Screen: NEGATIVE

## 2022-06-25 LAB — POCT INFLUENZA A/B
Influenza A, POC: NEGATIVE
Influenza B, POC: NEGATIVE

## 2022-06-25 MED ORDER — AZITHROMYCIN 250 MG PO TABS: ORAL_TABLET | ORAL | 0 refills | Status: DC

## 2022-06-25 MED ORDER — LIDOCAINE VISCOUS HCL 2 % MT SOLN: 10.0000 mL | OROMUCOSAL | 0 refills | Status: DC | PRN

## 2022-06-25 NOTE — ED Provider Notes (Signed)
[facility]    CSN: [id number] Arrival date & time: 06/25/22  1030      History   Chief Complaint Chief Complaint  Patient presents with   Sore Throat    Fever 101.8 chills body achesCOVID test was neg - Entered by patient    HPI Laurie Young is a 37 y.o. female.   Presenting today with 1 day history of sore swollen feeling throat, headache, chills, body aches.  Denies congestion, cough, chest pain, shortness of breath, abdominal pain, nausea vomiting or diarrhea.  Trying over-the-counter pain relievers with minimal relief.  Home COVID test was negative.    Past Medical History:  Diagnosis Date   Allergies    Anxiety    Exercise-induced asthma    Mental disorder    Panic attacks     Patient Active Problem List   Diagnosis Date Noted   Exercise-induced asthma 03/07/2021   Encounter for well woman exam with routine gynecological exam 02/08/2019   Encounter for initial prescription of vaginal ring hormonal contraceptive 02/08/2019    History reviewed. No pertinent surgical history.  OB History     Gravida  0   Para  0   Term  0   Preterm  0   AB  0   Living  0      SAB  0   IAB  0   Ectopic  0   Multiple  0   Live Births  0            Home Medications    Prior to Admission medications   Medication Sig Start Date End Date Taking? Authorizing Provider  azithromycin (ZITHROMAX) 250 MG tablet Take first 2 tablets together, then 1 every day until finished. 06/25/22  Yes Volney American, PA-C  lidocaine (XYLOCAINE) 2 % solution Use as directed 10 mLs in the mouth or throat every 3 (three) hours as needed for mouth pain. 06/25/22  Yes Volney American, PA-C  montelukast (SINGULAIR) 10 MG tablet Take 10 mg by mouth daily. 06/18/22  Yes [provider]  ALPRAZolam (XANAX) 0.25 MG tablet Take 1 tablet (0.25 mg total) by mouth as needed for anxiety. 06/06/22   Mozingo, Berdie Ogren, NP  budesonide-formoterol  (SYMBICORT) 80-4.5 MCG/ACT inhaler Inhale 2 puffs into the lungs 2 (two) times daily.    [provider]  etonogestrel-ethinyl estradiol (NUVARING) 0.12-0.015 MG/24HR vaginal ring Place 1 ring vaginally every 28 (twenty-eight) days. Insert vaginally and leave in place for 3 consecutive weeks, then remove for 1 week. 03/03/22   Megan Salon, MD  FLUoxetine (PROZAC) 20 MG capsule Take 1 capsule (20 mg total) by mouth daily. 06/06/22   Mozingo, Berdie Ogren, NP    Family History Family History  Problem Relation Age of Onset   Stroke Mother    Anxiety disorder Mother    Depression Mother     Social History Social History   Tobacco Use   Smoking status: Never   Smokeless tobacco: Never  Vaping Use   Vaping Use: Never used  Substance Use Topics   Alcohol use: Yes    Comment: socially   Drug use: Never     Allergies   Penicillins and Other   Review of Systems Review of Systems Per HPI  Physical Exam Triage Vital Signs ED Triage Vitals  Enc Vitals Group     BP 06/25/22 1102 (!) 149/91     Pulse Rate 06/25/22 1102 (!) 114  Resp 06/25/22 1102 20     Temp 06/25/22 1102 99.9 F (37.7 C)     Temp Source 06/25/22 1102 Oral     SpO2 06/25/22 1102 94 %     Weight --      Height --      Head Circumference --      Peak Flow --      Pain Score 06/25/22 1107 7     Pain Loc --      Pain Edu? --      Excl. in [place]? --    No data found.  Updated Vital Signs BP (!) 149/91 (BP Location: Right Arm)   Pulse (!) 114   Temp 99.9 F (37.7 C) (Oral)   Resp 20   LMP 06/25/2022 (Exact Date)   SpO2 94%   Visual Acuity Right Eye Distance:   Left Eye Distance:   Bilateral Distance:    Right Eye Near:   Left Eye Near:    Bilateral Near:     Physical Exam Vitals and nursing note reviewed.  Constitutional:      Appearance: Normal appearance.  HENT:     Head: Atraumatic.     Right Ear: Tympanic membrane and external ear normal.     Left Ear: Tympanic membrane  and external ear normal.     Nose: Nose normal.     Mouth/Throat:     Mouth: Mucous membranes are moist.     Pharynx: Oropharyngeal exudate and posterior oropharyngeal erythema present.  Eyes:     Extraocular Movements: Extraocular movements intact.     Conjunctiva/sclera: Conjunctivae normal.  Cardiovascular:     Rate and Rhythm: Normal rate and regular rhythm.     Heart sounds: Normal heart sounds.  Pulmonary:     Effort: Pulmonary effort is normal.     Breath sounds: Normal breath sounds. No wheezing or rales.  Musculoskeletal:        General: Normal range of motion.     Cervical back: Normal range of motion and neck supple.  Lymphadenopathy:     Cervical: Cervical adenopathy present.  Skin:    General: Skin is warm and dry.  Neurological:     Mental Status: She is alert and oriented to person, place, and time.  Psychiatric:        Mood and Affect: Mood normal.        Thought Content: Thought content normal.      UC Treatments / Results  Labs (all labs ordered are listed, but only abnormal results are displayed) Labs Reviewed  SARS CORONAVIRUS 2 (TAT 6-24 HRS)  POCT RAPID STREP A (OFFICE)  POCT INFLUENZA A/B    EKG   Radiology No results found.  Procedures Procedures (including critical care time)  Medications Ordered in UC Medications - No data to display  Initial Impression / Assessment and Plan / UC Course  I have reviewed the triage vital signs and the nursing notes.  Pertinent labs & imaging results that were available during my care of the patient were reviewed by me and considered in my medical decision making (see chart for details).     Tachycardic and low-grade fever in triage, otherwise vital signs reassuring.  Exam suspicious for bacterial tonsillitis.  Rapid strep and rapid flu negative, COVID test pending for further rule out but will cover with antibiotics, viscous lidocaine, supportive over-the-counter medications and home care.  Return for  worsening symptoms.  Final Clinical Impressions(s) / UC Diagnoses   Final  diagnoses:  Acute tonsillitis, unspecified etiology  Generalized body aches   Discharge Instructions   None    ED Prescriptions     Medication Sig Dispense Auth. Provider   azithromycin (ZITHROMAX) 250 MG tablet Take first 2 tablets together, then 1 every day until finished. 6 tablet Volney American, PA-C   lidocaine (XYLOCAINE) 2 % solution Use as directed 10 mLs in the mouth or throat every 3 (three) hours as needed for mouth pain. 100 mL Volney American, [place]      PDMP not reviewed this encounter.   Volney American, [place] 06/25/22 1414

## 2022-06-25 NOTE — ED Triage Notes (Signed)
Pt reports sore throat it is swollen and red, headache, chills, body aches x 1 day. Took covid test was neg

## 2022-06-26 ENCOUNTER — Ambulatory Visit: Payer: BC Managed Care – PPO

## 2022-06-26 LAB — SARS CORONAVIRUS 2 (TAT 6-24 HRS): SARS Coronavirus 2: POSITIVE — AB

## 2022-07-02 ENCOUNTER — Other Ambulatory Visit: Payer: Self-pay | Admitting: Adult Health

## 2022-07-02 DIAGNOSIS — F411 Generalized anxiety disorder: Secondary | ICD-10-CM

## 2022-07-03 NOTE — Telephone Encounter (Signed)
Has appt with Barnett Applebaum tomorrow

## 2022-07-04 ENCOUNTER — Ambulatory Visit: Payer: BC Managed Care – PPO | Admitting: Adult Health

## 2022-07-04 ENCOUNTER — Encounter: Payer: Self-pay | Admitting: Adult Health

## 2022-07-04 ENCOUNTER — Ambulatory Visit (INDEPENDENT_AMBULATORY_CARE_PROVIDER_SITE_OTHER): Payer: BC Managed Care – PPO | Admitting: Psychiatry

## 2022-07-04 DIAGNOSIS — F411 Generalized anxiety disorder: Secondary | ICD-10-CM

## 2022-07-04 DIAGNOSIS — Z636 Dependent relative needing care at home: Secondary | ICD-10-CM

## 2022-07-04 DIAGNOSIS — Z638 Other specified problems related to primary support group: Secondary | ICD-10-CM

## 2022-07-04 MED ORDER — HYDROXYZINE HCL 25 MG PO TABS: 25.0000 mg | ORAL_TABLET | Freq: Every evening | ORAL | 2 refills | Status: DC | PRN

## 2022-07-04 MED ORDER — FLUOXETINE HCL 20 MG PO CAPS: 20.0000 mg | ORAL_CAPSULE | Freq: Every day | ORAL | 2 refills | Status: DC
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
## 2022-07-04 NOTE — Progress Notes (Signed)
Laurie Young [id number] 23-Jun-1985 37 y.o.  Subjective:   Patient ID:  Laurie Young is a 37 y.o. (DOB 21-Apr-1986) female.  Chief Complaint: No chief complaint on file.   HPI [place] presents to the office today for follow-up of GAD and relationship problem.  Describes mood today as "ok". Pleasant. Denies tearfulness. Mood symptoms - reports decreased depression, anxiety and irritability. Reports decreased worry, rumination, and over thinking. Mood is consistent. Stating "I'm feeling ok". Feels like the Prozac is helpful. Not not using the Xanax. Reports ongoing situational stressors. Mother recovering from 4 strokes - mother lives her step dad and grandfather. Lacking interest and motivation.  Energy levels lower - recent Covid infection. Active, does not have a regular exercise routine - plans to start.   Enjoys some usual interests and activities. Lives with boyfriend of 3 years (has broken up 3 times). Mother and grandfather live together. Brother local. Sister in [place]. 2 step sisters. Spending time with family. Appetite adequate. Weight stable 140 pounds. Reports broken sleep. Averages 7 hours of broken sleep. Focus and concentration stable. Completing tasks. Managing aspects of household. Works full-time - remotely. Denies SI or HI.  Denies AH or VH. Denies self harm. Denies substance use.  Previous medication trials:  Prozac, Xanax  PHQ2-9    [place] Office Visit from 03/03/2022 in [facility] for [facility] at [place] Visit from 03/04/2021 in [facility] for [facility] [place] Visit from 02/08/2019 in [facility] for [place] [facility]  PHQ-2 Total Score 0 0 0      [place] ED from 06/25/2022 in [facility] at [facility] ED from 06/10/2021 in [facility] at [facility]) ED from 11/06/2020 in [facility] at [place] No Risk No Risk No Risk        Review of Systems:  Review of Systems  Musculoskeletal:  Negative for gait problem.  Neurological:  Negative for tremors.  Psychiatric/Behavioral:         Please refer to HPI    Medications: I have reviewed the patient's current medications.  Current Outpatient Medications  Medication Sig Dispense Refill   ALPRAZolam (XANAX) 0.25 MG tablet Take 1 tablet (0.25 mg total) by mouth as needed for anxiety. 30 tablet 2   azithromycin (ZITHROMAX) 250 MG tablet Take first 2 tablets together, then 1 every day until finished. 6 tablet 0   budesonide-formoterol (SYMBICORT) 80-4.5 MCG/ACT inhaler Inhale 2 puffs into the lungs 2 (two) times daily.     etonogestrel-ethinyl estradiol (NUVARING) 0.12-0.015 MG/24HR vaginal ring Place 1 ring vaginally every 28 (twenty-eight) days. Insert vaginally and leave in place for 3 consecutive weeks, then remove for 1 week. 3 each 3   FLUoxetine (PROZAC) 20 MG capsule Take 1 capsule (20 mg total) by mouth daily. 30 capsule 2   lidocaine (XYLOCAINE) 2 % solution Use as directed 10 mLs in the mouth or throat every 3 (three) hours as needed for mouth pain. 100 mL 0   montelukast (SINGULAIR) 10 MG tablet Take 10 mg by mouth daily.     No current facility-administered medications for this visit.    Medication Side Effects: None  Allergies:  Allergies  Allergen Reactions   Penicillins    Other     CATS    Past Medical History:  Diagnosis Date   Allergies    Anxiety  Exercise-induced asthma    Mental disorder    Panic attacks     Past Medical History, Surgical history, Social history, and Family history were reviewed and updated as appropriate.   Please see review of systems for further details on the patient's review from today.   Objective:   Physical Exam:  LMP 06/25/2022 (Exact Date)   Physical Exam Constitutional:      General: She is not in acute distress. Musculoskeletal:         General: No deformity.  Neurological:     Mental Status: She is alert and oriented to person, place, and time.     Coordination: Coordination normal.  Psychiatric:        Attention and Perception: Attention and perception normal. She does not perceive auditory or visual hallucinations.        Mood and Affect: Mood normal. Mood is not anxious or depressed. Affect is not labile, blunt, angry or inappropriate.        Speech: Speech normal.        Behavior: Behavior normal.        Thought Content: Thought content normal. Thought content is not paranoid or delusional. Thought content does not include homicidal or suicidal ideation. Thought content does not include homicidal or suicidal plan.        Cognition and Memory: Cognition and memory normal.        Judgment: Judgment normal.     Comments: Insight intact     Lab Review:  No results found for: "NA", "K", "CL", "CO2", "GLUCOSE", "BUN", "CREATININE", "CALCIUM", "PROT", "ALBUMIN", "AST", "ALT", "ALKPHOS", "BILITOT", "GFRNONAA", "GFRAA"  No results found for: "WBC", "RBC", "HGB", "HCT", "PLT", "MCV", "MCH", "MCHC", "RDW", "LYMPHSABS", "MONOABS", "EOSABS", "BASOSABS"  No results found for: "POCLITH", "LITHIUM"   No results found for: "PHENYTOIN", "PHENOBARB", "VALPROATE", "CBMZ"   .res Assessment: Plan:    Plan:  PDMP reviewed  Prozac '20mg'$  daily  Add Hydroxyzine '25mg'$  at hs for sleep  Continue Xanax 0.'25mg'$  daily prn - has not taken since last visit.  Discussed NAC tabs  RTC 3 months  Patient advised to contact office with any questions, adverse effects, or acute worsening in signs and symptoms.  There are no diagnoses linked to this encounter.   Please see After Visit Summary for patient specific instructions.  Future Appointments  Date Time Provider [place]  07/04/2022 10:00 AM Orrin Yurkovich, Berdie Ogren, NP [facility] None  03/23/2023 10:55 AM Megan Salon, MD [facility]    No orders of the defined types were  placed in this encounter.   -------------------------------

## 2022-07-14 ENCOUNTER — Ambulatory Visit (HOSPITAL_BASED_OUTPATIENT_CLINIC_OR_DEPARTMENT_OTHER): Payer: BC Managed Care – PPO | Admitting: Family Medicine

## 2022-07-14 ENCOUNTER — Encounter (HOSPITAL_BASED_OUTPATIENT_CLINIC_OR_DEPARTMENT_OTHER): Payer: Self-pay | Admitting: Family Medicine

## 2022-07-14 VITALS — BP 132/91 | HR 93 | Ht 65.0 in | Wt 148.6 lb

## 2022-07-14 DIAGNOSIS — E538 Deficiency of other specified B group vitamins: Secondary | ICD-10-CM | POA: Diagnosis not present

## 2022-07-14 DIAGNOSIS — J4520 Mild intermittent asthma, uncomplicated: Secondary | ICD-10-CM

## 2022-07-14 DIAGNOSIS — F419 Anxiety disorder, unspecified: Secondary | ICD-10-CM

## 2022-07-14 DIAGNOSIS — F32A Depression, unspecified: Secondary | ICD-10-CM | POA: Diagnosis not present

## 2022-07-14 DIAGNOSIS — R030 Elevated blood-pressure reading, without diagnosis of hypertension: Secondary | ICD-10-CM | POA: Diagnosis not present

## 2022-07-14 DIAGNOSIS — Z7689 Persons encountering health services in other specified circumstances: Secondary | ICD-10-CM

## 2022-07-14 NOTE — Progress Notes (Signed)
New Patient Office Visit  Subjective    Patient ID: [place], female    DOB: Feb 12, 1986  Age: 37 y.o. MRN: [id number]  CC: here to establish with new provider   Last PCP: Dr. Gerarda Fraction, most likely saw him last July 2023 for physical  GYN: Pap smear- Dr. Sabra Heck last saw Nov 2023-- last Pap smear results: ASCUS with Neg HPV 2022   Vitamin B12 Deficiency: B12 was low and he started her on B12 injections. Has received a total of 3-4 injections but missed a couple of months in Nov & Dec due to the holidays.  Currently taking B12 injections- due for next week. Would like to know if we can handle this for her   Depression/anxiety- sees therapist & psychiatrist to manage meds  Hydroxyzine has been helping with sleep. Was waking up frequently during the night. Sleeping throughout the night.   Oct 2023 stopped Accutane, elevated liver enzymes but was told they "should return to normal after stopping Accutane"   Asthma: Currently has allergist for asthma- Symbicort + montelukast   Was going to do allergy shots d/t price of inhaler but feeling better now on Symbicort (and is now more affordable)   Moved back from [place] in 2020  Outpatient Encounter Medications as of 07/14/2022  Medication Sig   ALPRAZolam (XANAX) 0.25 MG tablet Take 1 tablet (0.25 mg total) by mouth as needed for anxiety.   budesonide-formoterol (SYMBICORT) 80-4.5 MCG/ACT inhaler Inhale 2 puffs into the lungs 2 (two) times daily.   etonogestrel-ethinyl estradiol (NUVARING) 0.12-0.015 MG/24HR vaginal ring Place 1 ring vaginally every 28 (twenty-eight) days. Insert vaginally and leave in place for 3 consecutive weeks, then remove for 1 week.   FLUoxetine (PROZAC) 20 MG capsule Take 1 capsule (20 mg total) by mouth daily.   hydrOXYzine (ATARAX) 25 MG tablet Take 1 tablet (25 mg total) by mouth at bedtime as needed.   montelukast (SINGULAIR) 10 MG tablet Take 10 mg by mouth daily.   [DISCONTINUED] azithromycin (ZITHROMAX) 250 MG  tablet Take first 2 tablets together, then 1 every day until finished. (Patient not taking: Reported on 07/14/2022)   [DISCONTINUED] lidocaine (XYLOCAINE) 2 % solution Use as directed 10 mLs in the mouth or throat every 3 (three) hours as needed for mouth pain. (Patient not taking: Reported on 07/14/2022)   No facility-administered encounter medications on file as of 07/14/2022.    Past Medical History:  Diagnosis Date   Allergies    Anxiety    Elevated blood pressure reading in office without diagnosis of hypertension 07/15/2022   Exercise-induced asthma    Mental disorder    Panic attacks     History reviewed. No pertinent surgical history.  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   Tobacco Use   Smoking status: Never   Smokeless tobacco: Never  Vaping Use   Vaping Use: Never used  Substance and Sexual Activity   Alcohol use: Yes    Comment: socially   Drug use: Never   Sexual activity: Yes    Birth control/protection: Inserts  Other  Topics Concern   Not on file  Social History Narrative   Not on file   Social Determinants of Health   Financial Resource Strain: Not on file  Food Insecurity: Not on file  Transportation Needs: Not on file  Physical Activity: Not on file  Stress: Not on file  Social Connections: Not on file  Intimate Partner Violence: Not on file    Review of Systems  Constitutional:  Positive for malaise/fatigue (lethargy). Negative for chills and fever.  Respiratory:  Negative for cough, shortness of breath and wheezing.   Cardiovascular:  Negative for chest pain.  Gastrointestinal:  Negative for nausea and vomiting.   Musculoskeletal:  Negative for myalgias.  Neurological:  Negative for dizziness and headaches.  Psychiatric/Behavioral:  Negative for depression and suicidal ideas. The patient is nervous/anxious. The patient does not have insomnia.         Objective    BP (!) 132/91   Pulse 93   Ht 5\' 5"  (1.651 m)   Wt 148 lb 9.6 oz (67.4 kg)   LMP 06/25/2022 (Exact Date)   SpO2 100%   BMI 24.73 kg/m   Physical Exam Cardiovascular:     Rate and Rhythm: Normal rate and regular rhythm.     Heart sounds: Normal heart sounds.  Pulmonary:     Effort: Pulmonary effort is normal.     Breath sounds: Normal breath sounds.  Neurological:     Mental Status: She is alert.  Psychiatric:        Mood and Affect: Mood normal.        Behavior: Behavior normal.        Thought Content: Thought content normal.        Judgment: Judgment normal.       Assessment & Plan:   Problem List Items Addressed This Visit       Respiratory   Mild intermittent asthma without complication    Reports that she has issues with environment allergies (especially cat dander) and asthma, but currently her asthma is under control with Symbicort 2 puffs BID and Singulair 10mg  daily. She does she an allergist for her asthma. She is not taking allergy shots at this time.          Other   Vitamin B12 deficiency    Reports that her former PCP checked her vitamin B12 levels and found her to be deficient. She would like to receive vitamin B12 injections. Reports still feeling very lethargic and fatigued even with supplementation. I would like to get her records prior to continuing on B12 injections to see what her level was. If we do not get these back before next week, reasonable to repeat labs, as she is due for another injection next week.       Anxiety and depression    Patient reports having a history of anxiety and depression. Mood is currently controlled on Prozac 20mg . She does take hydroxyzine 25mg  at night and  reports that it is helping her sleep throughout the night, without many nighttime awakenings. She does have 0.25mg  Xanax PRN but does not take this often. All medications are prescribed by her psychiatrist. She is also seeing a therapist and has noticed benefits of CBT.        Encounter to establish care with new doctor - Primary    Patient is here to establish care with new provider, as her former PCP is retiring soon. Last seen in July 2023 for a physical. Plan to repeat  physical in July 2024.      Elevated blood pressure reading in office without diagnosis of hypertension    Advised patient that her blood pressure is slightly elevated today. BP recheck did not improve. She reports being stressed out between taking care of her grandfather, her mother, and her occupation. Educated patient to take occasional BP at home, as she does have access to a BP machine. Advised her to seek emergency care if she experiences shortness of breath, chest pain, changes in vision, or any other acute symptoms.        Return in about 4 months (around 10/31/2022) for annual physical exam . Will obtain records from previous provider.   Les Pou, FNP

## 2022-07-15 ENCOUNTER — Encounter (HOSPITAL_BASED_OUTPATIENT_CLINIC_OR_DEPARTMENT_OTHER): Payer: Self-pay | Admitting: Family Medicine

## 2022-07-15 DIAGNOSIS — R030 Elevated blood-pressure reading, without diagnosis of hypertension: Secondary | ICD-10-CM

## 2022-07-15 DIAGNOSIS — F419 Anxiety disorder, unspecified: Secondary | ICD-10-CM | POA: Insufficient documentation

## 2022-07-15 DIAGNOSIS — E538 Deficiency of other specified B group vitamins: Secondary | ICD-10-CM | POA: Insufficient documentation

## 2022-07-15 DIAGNOSIS — J4520 Mild intermittent asthma, uncomplicated: Secondary | ICD-10-CM | POA: Insufficient documentation

## 2022-07-15 DIAGNOSIS — Z7689 Persons encountering health services in other specified circumstances: Secondary | ICD-10-CM | POA: Insufficient documentation

## 2022-07-15 HISTORY — DX: Elevated blood-pressure reading, without diagnosis of hypertension: R03.0

## 2022-07-15 NOTE — Assessment & Plan Note (Signed)
Reports that her former PCP checked her vitamin B12 levels and found her to be deficient. She would like to receive vitamin B12 injections. Reports still feeling very lethargic and fatigued even with supplementation. I would like to get her records prior to continuing on B12 injections to see what her level was. If we do not get these back before next week, reasonable to repeat labs, as she is due for another injection next week.

## 2022-07-15 NOTE — Assessment & Plan Note (Signed)
Reports that she has issues with environment allergies (especially cat dander) and asthma, but currently her asthma is under control with Symbicort 2 puffs BID and Singulair 10mg  daily. She does she an allergist for her asthma. She is not taking allergy shots at this time.

## 2022-07-15 NOTE — Assessment & Plan Note (Signed)
Advised patient that her blood pressure is slightly elevated today. BP recheck did not improve. She reports being stressed out between taking care of her grandfather, her mother, and her occupation. Educated patient to take occasional BP at home, as she does have access to a BP machine. Advised her to seek emergency care if she experiences shortness of breath, chest pain, changes in vision, or any other acute symptoms.

## 2022-07-15 NOTE — Assessment & Plan Note (Signed)
Patient is here to establish care with new provider, as her former PCP is retiring soon. Last seen in July 2023 for a physical. Plan to repeat physical in July 2024.

## 2022-07-15 NOTE — Assessment & Plan Note (Signed)
Patient reports having a history of anxiety and depression. Mood is currently controlled on Prozac 20mg . She does take hydroxyzine 25mg  at night and reports that it is helping her sleep throughout the night, without many nighttime awakenings. She does have 0.25mg  Xanax PRN but does not take this often. All medications are prescribed by her psychiatrist. She is also seeing a therapist and has noticed benefits of CBT.

## 2022-07-19 ENCOUNTER — Ambulatory Visit (INDEPENDENT_AMBULATORY_CARE_PROVIDER_SITE_OTHER): Payer: BC Managed Care – PPO | Admitting: Psychiatry

## 2022-07-19 DIAGNOSIS — Z636 Dependent relative needing care at home: Secondary | ICD-10-CM | POA: Diagnosis not present

## 2022-07-19 DIAGNOSIS — Z638 Other specified problems related to primary support group: Secondary | ICD-10-CM

## 2022-07-19 DIAGNOSIS — F411 Generalized anxiety disorder: Secondary | ICD-10-CM | POA: Diagnosis not present

## 2022-07-19 NOTE — Progress Notes (Signed)
Psychotherapy Progress Note [facility] Marliss Czar, PhD LP  Patient ID: Laurie Young)    MRN: [id number] Therapy format: Individual psychotherapy Date: 07/19/2022      Start: 8:14a     Stop: 9:02a     Time Spent: 48 min Location: Telehealth visit -- I connected with this patient by an approved telecommunication method (video), with her informed consent, and verifying identity and patient privacy.  I was located at my office and patient at her home.  As needed, we discussed the limitations, risks, and security and privacy concerns associated with telehealth service, including the availability and conditions which currently govern in-person appointments and the possibility that 3rd-party payment may not be fully guaranteed and she may be responsible for charges.  After she indicated understanding, we proceeded with the session.  Also discussed treatment planning, as needed, including ongoing verbal agreement with the plan, the opportunity to ask and answer all questions, her demonstrated understanding of instructions, and her readiness to call the office should symptoms worsen or she feels she is in a crisis state and needs more immediate and tangible assistance.   Session narrative (presenting needs, interim history, self-report of stressors and symptoms, applications of prior therapy, status changes, and interventions made in session) Zack broke up with her.  3rd time now, says she was blindsided by it, though she did go in not fully trusting.  Tearfully says she really doesn't put forth trust or faith any more.  Knows he desperately needs mental health help but just won't treat his depression, which has included weeks and weeks of him not getting out of bed.  In Nov he refused the offer of therapy b/c (1) he thought he wouldn't be honest, and (2) he supposed he would be told he's just in a bad relationship, both of which sound cloying, in part.  She found an Nurse, children's, he languished through January not deciding whether to engage, and now he's pulling the plug.  Support/empathy provided.   Kicking herself for defending him to her mom before all this, too, and now for getting to 37 without focusing on an actual prospect.  Mom did have her say last week when he came to get some stuff -- about 20 minutes worth of confrontation toward him, that he seemed to take in adult fashion.  Overall read is that he is too depressed to count on, too lazy and self-hating, and eventually you have to let the guy do as he is determined to do.  Not devastated, actually, though tearful talking about it.  Able to cry for the first time today, in session, she says, after having been worried a while that she's just gone cold and maybe lost herself to cynicism.  Assured that she knows how to love -- it's clear she's been trying -- and she doesn't have to right now, while it hurts too much.  Meanwhile very admirable to offer that her family can still be his friends, that his welfare means more than whether their relationship pans out.  On another tack, snafu between S Allie and M, where Allie asked M to take granddaughter to a soccer game, and she accepted but forgot the details, remembering that she agreed to be at the game but not to take her to it.  Amilee volunteered to drive her, despite being banned by [place] (old story), but [name] it off and tried to make M responsible for not telling her earlier.  Based  on past behavior, Jozalynn expects Gerarda Gunther will freeze out M for a couple weeks.  When M had her stroke, Gerarda Gunther tried to ban Xitlaly from the ICU and claim it was about "respecting her time", and when Doaa thought it over and came anyway, it set off another eruption for seeming to horn in and defy.  Further tension around estate and powers.  Tola holds title as Product manager and as the executor of M's will, anticipates strife to come over her role and over M's will provisions  that Makena gets to have the family farmhouse (which she currently lives in for the cost of maintenance) and settle up by thirds with her siblings based on tax value at the time of M's passing.  Knows she needs to have a conversation with Allie but avoiding it, given fresh indications how reactive she is.  Recommended 3 steps to best recruit her -- ask and secure consent to the conversation, ask and secure consent to take turns, and offer to take the first turn active listening, e.g., summarizing what she has already heard matters to [place], regardless of any grievances about she conducts herself.  Chalet rates that very hard to stomach but sees wisdom in it and recognizes how it could be winsome.  Therapeutic modalities: Cognitive Behavioral Therapy, Solution-Oriented/Positive Psychology, Ego-Supportive, and Assertiveness/Communication  Mental Status/Observations:  Appearance:   Casual     Behavior:  Appropriate  Motor:  Normal  Speech/Language:   Clear and Coherent  Affect:  Appropriate  Mood:  sad and dep on topic  Thought process:  normal  Thought content:    WNL  Sensory/Perceptual disturbances:    WNL  Orientation:  Fully oriented  Attention:  Good    Concentration:  Good  Memory:  WNL  Insight:    Good  Judgment:   Good  Impulse Control:  Good   Risk Assessment: Danger to Self: No Self-injurious Behavior: No Danger to Others: No Physical Aggression / Violence: No Duty to Warn: No Access to Firearms a concern: No  Assessment of progress:  progressing  Diagnosis:   ICD-10-CM   1. Generalized anxiety disorder  F41.1     2. Relationship problem with family member  Z63.8     3. Caregiver stress  Z63.6      Plan:  Relationship loss -- Self-affirm she's done faithful and loving, it's just hard to face losing what you counted on Family tension -- Consider offer to Allie of an active-listening and perception-check encounter to bring down her resentment.   Sleep -- Look  into blue light control (screens only, or add orange lenses) and stimulus control (worry ahead, worry out of bed).  Will work on circadian rhythm to better provide for rest and recovery. Worry -- May introduce journaling, relaxation, and rational reappraisal skills Other recommendations/advice as may be noted above Continue to utilize previously learned skills ad lib Maintain medication as prescribed and work faithfully with relevant prescriber(s) if any changes are desired or seem indicated Call the clinic on-call service, 988/hotline, 911, or present to [facility] or ER if any life-threatening psychiatric crisis Return for time as already scheduled, avail earlier @ PT's need. Already scheduled visit in this office 08/09/2022.  Robley Fries, PhD Marliss Czar, PhD LP Clinical Psychologist, [facility] [address], [address] [place], [place] [address] [phone]

## 2022-07-26 ENCOUNTER — Other Ambulatory Visit: Payer: Self-pay | Admitting: Adult Health

## 2022-07-26 DIAGNOSIS — F411 Generalized anxiety disorder: Secondary | ICD-10-CM

## 2022-07-27 ENCOUNTER — Encounter (HOSPITAL_BASED_OUTPATIENT_CLINIC_OR_DEPARTMENT_OTHER): Payer: Self-pay | Admitting: Family Medicine

## 2022-08-09 ENCOUNTER — Ambulatory Visit (INDEPENDENT_AMBULATORY_CARE_PROVIDER_SITE_OTHER): Payer: BC Managed Care – PPO | Admitting: Psychiatry

## 2022-08-09 DIAGNOSIS — F411 Generalized anxiety disorder: Secondary | ICD-10-CM

## 2022-08-09 DIAGNOSIS — Z636 Dependent relative needing care at home: Secondary | ICD-10-CM | POA: Diagnosis not present

## 2022-08-09 DIAGNOSIS — Z638 Other specified problems related to primary support group: Secondary | ICD-10-CM

## 2022-08-09 NOTE — Progress Notes (Signed)
Psychotherapy Progress Note [facility] Marliss Czar, PhD LP  Patient ID: Laurie Young)    MRN: [id number] Therapy format: Individual psychotherapy Date: 08/09/2022      Start: 2:14p     Stop: 3:01p     Time Spent: 47 min Location: In-person   Session narrative (presenting needs, interim history, self-report of stressors and symptoms, applications of prior therapy, status changes, and interventions made in session) About a month since Zack broke up with her.  Not really cried, other than last session, hasn't really needed to.  Does feel she has accepting his decision as clarifying, and for the best, that he is truly not up to the relationship or commitment.  Just disappointing to have to turn loose of hope of companionship she had.  Last week had 37th birthday, plus funeral of a high school classmate.  Oddly, may have an opportunity to befriend a woman she thought was bitchy in high school.  Not spooky, just intriguing.  Story of coming home from [place], to where she fled years ago, in 2020 and setting up again in [place], working remotely in [employer], establishing with a company here.  Examined tendency to feel guilty, something she learned from mom's automatic question "What did you do?" when upset.  Framed healthy adjustment not as being guilt-free but able to let regret be a call to check how her values, self-expectations, and behavior line up and then to change whatever is out of whack.  Discussed dynamics with S Allie, coming to think of her as "resent-aholic".  Continued working up to risk a Oncologist to her, centering on just listening, not making any claims.  Shared personal story of once overcoming an adversary's resentment by doing something similar, and coached in key elements.  Therapeutic modalities: Cognitive Behavioral Therapy, Solution-Oriented/Positive Psychology, and Ego-Supportive  Mental Status/Observations:  Appearance:   Casual      Behavior:  Appropriate  Motor:  Normal  Speech/Language:   Clear and Coherent  Affect:  Appropriate  Mood:  sober  Thought process:  normal  Thought content:    WNL and some guilt  Sensory/Perceptual disturbances:    WNL  Orientation:  Fully oriented  Attention:  Good    Concentration:  Good  Memory:  WNL  Insight:    Good  Judgment:   Good  Impulse Control:  Good   Risk Assessment: Danger to Self: No Self-injurious Behavior: No Danger to Others: No Physical Aggression / Violence: No Duty to Warn: No Access to Firearms a concern: No  Assessment of progress:  progressing  Diagnosis:   ICD-10-CM   1. Generalized anxiety disorder  F41.1     2. Relationship problem with family member  Z63.8     3. Caregiver stress  Z63.6      Plan:  Relationship loss -- Self-affirm she's done faithful and loving, it's just hard to face losing what you counted on Family tension -- Consider offer to Allie of an active-listening and perception-check encounter to bring down her resentment.   Sleep -- Look into blue light control (screens only, or add orange lenses) and stimulus control (worry ahead, worry out of bed).  Will work on circadian rhythm to better provide for rest and recovery. Worry -- May introduce journaling, relaxation, and rational reappraisal skills Social support -- Explore available friendships as noted Other recommendations/advice as may be noted above Continue to utilize previously learned skills ad lib Maintain medication as prescribed and work  faithfully with relevant prescriber(s) if any changes are desired or seem indicated Call the clinic on-call service, 988/hotline, 911, or present to [facility] or ER if any life-threatening psychiatric crisis Return for time as already scheduled. Already scheduled visit in this office 08/23/2022.  Robley Fries, PhD Marliss Czar, PhD LP Clinical Psychologist, [facility] [address], [address] [place], [place] [address] [phone]

## 2022-08-23 ENCOUNTER — Ambulatory Visit (INDEPENDENT_AMBULATORY_CARE_PROVIDER_SITE_OTHER): Payer: BC Managed Care – PPO | Admitting: Psychiatry

## 2022-08-23 DIAGNOSIS — F411 Generalized anxiety disorder: Secondary | ICD-10-CM | POA: Diagnosis not present

## 2022-08-23 DIAGNOSIS — Z638 Other specified problems related to primary support group: Secondary | ICD-10-CM | POA: Diagnosis not present

## 2022-08-23 DIAGNOSIS — Z636 Dependent relative needing care at home: Secondary | ICD-10-CM

## 2022-08-23 NOTE — Progress Notes (Signed)
Psychotherapy Progress Note [facility] Marliss Czar, PhD LP  Patient ID: Unborn Carreon Cedano Clinesmith)    MRN: [id number] Therapy format: Individual psychotherapy Date: 08/23/2022      Start: 10:07a     Stop: 10:53a     Time Spent: 46 min Location: Telehealth visit -- I connected with this patient by an approved telecommunication method (video), with her informed consent, and verifying identity and patient privacy.  I was located at my office and patient at her home.  As needed, we discussed the limitations, risks, and security and privacy concerns associated with telehealth service, including the availability and conditions which currently govern in-person appointments and the possibility that 3rd-party payment may not be fully guaranteed and she may be responsible for charges.  After she indicated understanding, we proceeded with the session.  Also discussed treatment planning, as needed, including ongoing verbal agreement with the plan, the opportunity to ask and answer all questions, her demonstrated understanding of instructions, and her readiness to call the office should symptoms worsen or she feels she is in a crisis state and needs more immediate and tangible assistance.   Session narrative (presenting needs, interim history, self-report of stressors and symptoms, applications of prior therapy, status changes, and interventions made in session) Switched to video due to late-breaking need to carry M to cancer treatment.  Still sanguine with the breakup, refreshed assessment that she has no outstanding regrets, has learned.   Has considered but not acted on an outreach to [employer].  More prominent is her boss's narcissistic ways and common complaints from coworkers.  2 years working remotely, though boss Madelaine Bhat is local, he recently bought the company, relocated equipment to [employer], and he seems to keep requiring pointless trips to the new warehouse and studio downtown to watch things  that just seem like vanity trips for him.  Recent crunch with a planned work day getting thrown off completely by his wish to gather at the building and put lots of energy into seeming cool rather than the actual work needed, and last-minute plan changes.  Acknowledged several layers of cognitive dissonance with her work, largely being backed off for being too industrious and pressed to sit on her hands more than comes naturally.  Only woman in the company, too, which sometimes gets her double standards.  Acknowledged she is that talented, and that she has always been the smart kid others became jealous of, just painful to have it still happening at almost 38.  Reveals Madelaine Bhat is also "[place] sober" -- not drinking, but regularly using pot, which fits accounts of his behavior and fragmented memory.    Reviewed responses to dissonant, dysfunctional, and sometimes absurd conditions, affirmed her handling, agreed permission to be "practical" until either she finds a better work situation or the company crumbles and the workers band together to buy it and run it constructively.  Able to take affirmation from feedback others have given her, both about her great competence in the job and the poise she is showing dealing with otherwise maddening things.  Raised the possibility she is even positioned to be doing quiet "missionary" work in how she carries out her responsibilities and how she communicates under stress with Adam.  Relationship loss seems to be resolved at this point.  Therapeutic modalities: Cognitive Behavioral Therapy, Solution-Oriented/Positive Psychology, and Ego-Supportive  Mental Status/Observations:  Appearance:   Casual     Behavior:  Appropriate  Motor:  Normal  Speech/Language:   Clear and Coherent  Affect:  Appropriate  Mood:  Irritated dep on subject  Thought process:  normal  Thought content:    WNL  Sensory/Perceptual disturbances:    WNL  Orientation:  Fully oriented   Attention:  Good    Concentration:  Good  Memory:  WNL  Insight:    Good  Judgment:   Good  Impulse Control:  Good   Risk Assessment: Danger to Self: No Self-injurious Behavior: No Danger to Others: No Physical Aggression / Violence: No Duty to Warn: No Access to Firearms a concern: No  Assessment of progress:  progressing  Diagnosis:   ICD-10-CM   1. Generalized anxiety disorder  F41.1     2. Caregiver stress  Z63.6     3. Relationship problem with family member  Z63.8      Plan:  Relationship loss -- As needed, self-affirm she's done faithful and loving, it's just hard to face losing what you counted on Family tension -- Still consider offer to [facility] of a time of active listening and perception checking to credibly offer to help Allie's resentment and great herself the flexibility of not having to fight or expect it.   Work stress -- Self-affirm that she is a minority in gender and mindset, and there will be issues from the boss's dysfunctional ways.  Endorse 1:1 conference PRN or seeking other work if needed.  Meanwhile, reframe her own values and work as an undeclared form of mission work, demonstrating how it can be done. Sleep -- Look into blue light control (screens only, or add orange lenses) and stimulus control (worry ahead, worry out of bed).  Will work on circadian rhythm to better provide for rest and recovery. Worry -- May introduce journaling, relaxation, and rational reappraisal skills Social support -- Explore available friendships as noted.  Al-Anon a possibility if available to it. Other recommendations/advice as may be noted above Continue to utilize previously learned skills ad lib Maintain medication as prescribed and work faithfully with relevant prescriber(s) if any changes are desired or seem indicated Call the clinic on-call service, 988/hotline, 911, or present to [facility] or ER if any life-threatening psychiatric crisis Return for time as already  scheduled. Already scheduled visit in this office 08/29/2022.  Robley Fries, PhD Marliss Czar, PhD LP Clinical Psychologist, [facility] [address] [place], [place] [address] [phone]

## 2022-08-29 ENCOUNTER — Ambulatory Visit: Payer: BC Managed Care – PPO | Admitting: Psychiatry

## 2022-09-13 ENCOUNTER — Ambulatory Visit: Payer: BC Managed Care – PPO | Admitting: Psychiatry

## 2022-09-14 ENCOUNTER — Other Ambulatory Visit: Payer: Self-pay | Admitting: Adult Health

## 2022-09-14 DIAGNOSIS — F411 Generalized anxiety disorder: Secondary | ICD-10-CM

## 2022-09-21 ENCOUNTER — Ambulatory Visit: Payer: BC Managed Care – PPO | Admitting: Psychiatry

## 2022-09-21 DIAGNOSIS — Z638 Other specified problems related to primary support group: Secondary | ICD-10-CM

## 2022-09-21 DIAGNOSIS — F5102 Adjustment insomnia: Secondary | ICD-10-CM | POA: Diagnosis not present

## 2022-09-21 DIAGNOSIS — Z636 Dependent relative needing care at home: Secondary | ICD-10-CM | POA: Diagnosis not present

## 2022-09-21 DIAGNOSIS — F411 Generalized anxiety disorder: Secondary | ICD-10-CM

## 2022-09-21 NOTE — Progress Notes (Signed)
Psychotherapy Progress Note [facility] Marliss Czar, PhD LP  Patient ID: Laurie Young)    MRN: [id number] Therapy format: Individual psychotherapy Date: 09/21/2022      Start: 10:11     Stop: 11:00a     Time Spent: 49 min Location: In-person   Session narrative (presenting needs, interim history, self-report of stressors and symptoms, applications of prior therapy, status changes, and interventions made in session) Sleeping pretty well.  A month of hydroxyzine helped her reset her durable sleep, then became too dopey-making in the morning.  Educated on long effects and parasympathetic rebound if she has been persistently short.  Encouraged go on with non-pharm measures to help sleep and keep hydroxyzine available, just start an hour before intended bedtime.  Melatonin option as well.  Otherwise, "agitated."  [place] felt better, was feeling like she might be more adjusted to the stresses, especially of sister's resentment-addicted behavior, then ran into new provocations with Laurie Young around Mother's Day.  Mom glossed over her plan to drive to [facility] to visit Laurie Young, et al, when she is on driving restriction du to stoke and seizure risk.  Turns out she has been giving sister a far too rosy picture of her health to avoid awkwardness, and contributing to her having self-centered ideas of who can do the work of getting together.  Laurie Young blocked the plan to drive herself, started to work out a way for Mom to visit (e.g., go by train, on grounds that she could rive Mom but Laurie Young would blow a gasket if she ad to encounter Laurie Young, and convinced herself Laurie Young would hold it over her head if she made the effort).  All leading to Mom to get nervous, Laurie Young to live down  to predictions, then eventually offering a plan to get Mom to [facility] by way of her husband, Laurie Young.  Unfortunately, Mom was on pins and needles the whole weekend, which seems to be no help to her health, esp.  after she tried to confide to Laurie Young about Laurie Young's unyielding and having him reiterate the resentful party line about Laurie Young.  Now considering just driving mom at some point and make them deal with it.  Discussed pros, cons, alternatives in approaching these lose-lose scenarios, endorsed seeking a 1:1 opportunity with Laurie Young to see if she can offer to her no need to trust her, free to think whatever she wants, but could they collaborate at least on doing the sensible thing by Mom's health, e.g., Laurie Young drive her.  Still advocate beng ready to offer unconditional listening to Laurie Young's grievances as a peace offering and an example that puts th lie to Laurie Young's ideas about her.  Therapeutic modalities: Cognitive Behavioral Therapy, Solution-Oriented/Positive Psychology, and Ego-Supportive  Mental Status/Observations:  Appearance:   Casual     Behavior:  Appropriate  Motor:  Normal  Speech/Language:   Clear and Coherent  Affect:  Appropriate  Mood:  normal and tense with subjects  Thought process:  normal  Thought content:    WNL  Sensory/Perceptual disturbances:    WNL  Orientation:  Fully oriented  Attention:  Good    Concentration:  Good  Memory:  WNL  Insight:    Good  Judgment:   Good  Impulse Control:  Good   Risk Assessment: Danger to Self: No Self-injurious Behavior: No Danger to Others: No Physical Aggression / Violence: No Duty to Warn: No Access to Firearms a concern: No  Assessment of progress:  progressing  Diagnosis:  ICD-10-CM   1. Generalized anxiety disorder  F41.1     2. Caregiver stress  Z63.6     3. Relationship problem with family member  Z63.8     4. Insomnia due to psychological stress  F51.02      Plan:  Family tension -- Still consider offer to Laurie Young of a time of unconditional, 1-sided active listening and perception checking.  May also make practical appeal for full honesty and good sense in serving Mom's health and restrictions. Work stress --  Self-affirm that she is a minority in gender and mindset, and there will be issues from the boss's dysfunctional ways.  Endorse 1:1 conference PRN or seeking other work if needed.  Meanwhile, reframe her own values and work as an undeclared form of mission work, demonstrating how it can be done. Sleep -- Look into blue light control (screens only, or add orange lenses) and stimulus control (worry ahead, worry out of bed).  Will work on circadian rhythm to better provide for rest and recovery.  Endorse hydroxyzine PRN, just 1 hr before intended sleep, to minimize morning SE.  Option melatonin. Worry -- May introduce journaling, relaxation, and rational reappraisal skills Social support -- Explore available friendships as noted.  Al-Anon a possibility if available to it. Other recommendations/advice as may be noted above Continue to utilize previously learned skills ad lib Maintain medication as prescribed and work faithfully with relevant prescriber(s) if any changes are desired or seem indicated Call the clinic on-call service, 988/hotline, 911, or present to Laurie Young or ER if any life-threatening psychiatric crisis Return for time as already scheduled. Already scheduled visit in this office 09/28/2022.  Robley Fries, PhD Marliss Czar, PhD LP Clinical Psychologist, [facility] [address], [address] [place], [place] [address] [phone]

## 2022-09-28 ENCOUNTER — Ambulatory Visit: Payer: BC Managed Care – PPO | Admitting: Psychiatry

## 2022-10-04 ENCOUNTER — Ambulatory Visit: Payer: BC Managed Care – PPO | Admitting: Psychiatry

## 2022-10-04 ENCOUNTER — Ambulatory Visit: Payer: BC Managed Care – PPO | Admitting: Adult Health

## 2022-10-25 ENCOUNTER — Ambulatory Visit: Payer: BC Managed Care – PPO | Admitting: Licensed Clinical Social Worker

## 2022-10-31 ENCOUNTER — Encounter (HOSPITAL_BASED_OUTPATIENT_CLINIC_OR_DEPARTMENT_OTHER): Payer: BC Managed Care – PPO | Admitting: Family Medicine

## 2022-11-01 ENCOUNTER — Ambulatory Visit: Payer: BC Managed Care – PPO | Admitting: Licensed Clinical Social Worker

## 2022-11-08 ENCOUNTER — Ambulatory Visit (INDEPENDENT_AMBULATORY_CARE_PROVIDER_SITE_OTHER): Payer: BC Managed Care – PPO | Admitting: Family Medicine

## 2022-11-08 ENCOUNTER — Encounter (HOSPITAL_BASED_OUTPATIENT_CLINIC_OR_DEPARTMENT_OTHER): Payer: Self-pay | Admitting: Family Medicine

## 2022-11-08 VITALS — BP 134/80 | HR 89 | Ht 65.0 in | Wt 142.7 lb

## 2022-11-08 DIAGNOSIS — E538 Deficiency of other specified B group vitamins: Secondary | ICD-10-CM

## 2022-11-08 DIAGNOSIS — Z0000 Encounter for general adult medical examination without abnormal findings: Secondary | ICD-10-CM

## 2022-11-08 DIAGNOSIS — R030 Elevated blood-pressure reading, without diagnosis of hypertension: Secondary | ICD-10-CM | POA: Diagnosis not present

## 2022-11-08 NOTE — Progress Notes (Signed)
Complete physical exam  Patient: Laurie Young   DOB: 1985/07/10   37 y.o. Female  MRN: [id number]  Subjective:    Laurie Young is a 37 y.o. female who presents today for a complete physical exam. She reports consuming a general diet. The patient does not participate in regular exercise at present. She generally feels fairly well. She reports sleeping fairly well. She does have additional problems to discuss today.   Reports feeling tired all the time- having no energy, first noticed this over the past year. At first, she felt like this was related to her depression or not getting adequate sleep. However, she feels "this sluggish drag is new." She reports sleeping throughout the night with PRN use of hydroxyzine.   She reports occasional swelling in her hands and ankles after working out or sitting for a prolonged period of time. Reports abdominal bloating and flatulence with food consumption- however, she is unable to pinpoint a certain food or ingredient that causes this. Reports she watches her sodium intake and has a meal delivery service that is portion controlled. She reports she is not working out regularly- but this is due to her lack of motivation and excessive fatigue. She has been checking her blood pressure readings at home (did not bring into office due to issues syncing with app). Highest home BP readings: 151/100, 143/105, 159/107, 150/103. Denies chest pain, palpitations, changes in vision, shortness of breath, lower extremity edema, headaches, lightheadedness, weakness, cough.    Was on Accutane in the past- history of elevated LFTs    Depression screenings:    11/08/2022    9:44 AM 03/03/2022   10:34 AM 03/04/2021    1:56 PM  Depression screen PHQ 2/9  Decreased Interest 0 0 0  Down, Depressed, Hopeless 1 0 0  PHQ - 2 Score 1 0 0  Altered sleeping 1    Tired, decreased energy 3    Change in appetite 0    Feeling bad or failure about yourself  0    Trouble  concentrating 0    Moving slowly or fidgety/restless 0    Suicidal thoughts 0    PHQ-9 Score 5    Difficult doing work/chores Somewhat difficult      Anxiety screenings:    11/08/2022    9:45 AM  GAD 7 : Generalized Anxiety Score  Nervous, Anxious, on Edge 1  Control/stop worrying 0  Worry too much - different things 0  Trouble relaxing 1  Restless 0  Easily annoyed or irritable 0  Afraid - awful might happen 0  Total GAD 7 Score 2  Anxiety Difficulty Somewhat difficult   UTD on vision and dental regular appts.   Patient Care Team: Alyson Reedy, FNP as PCP - General (Family Medicine)   Outpatient Medications Prior to Visit  Medication Sig   ALPRAZolam (XANAX) 0.25 MG tablet Take 1 tablet (0.25 mg total) by mouth as needed for anxiety.   budesonide-formoterol (SYMBICORT) 80-4.5 MCG/ACT inhaler Inhale 2 puffs into the lungs 2 (two) times daily.   etonogestrel-ethinyl estradiol (NUVARING) 0.12-0.015 MG/24HR vaginal ring Place 1 ring vaginally every 28 (twenty-eight) days. Insert vaginally and leave in place for 3 consecutive weeks, then remove for 1 week.   FLUoxetine (PROZAC) 20 MG capsule TAKE 1 CAPSULE BY MOUTH EVERY DAY   hydrOXYzine (ATARAX) 25 MG tablet TAKE 1 TABLET BY MOUTH AT BEDTIME AS NEEDED.   montelukast (SINGULAIR) 10 MG tablet Take 10 mg by mouth daily.  No facility-administered medications prior to visit.   ROS     Objective:     BP 134/80   Pulse 89   Ht 5\' 5"  (1.651 m)   Wt 142 lb 11.2 oz (64.7 kg)   SpO2 100%   BMI 23.75 kg/m  BP Readings from Last 3 Encounters:  11/08/22 134/80  07/14/22 (!) 132/91  06/25/22 (!) 149/91   Physical Exam      Assessment & Plan:    Routine Health Maintenance and Physical Exam  Health Maintenance  Topic Date Due   COVID-19 Vaccine (1) 02/27/2020   Flu Shot  12/01/2022   Pap Smear  03/04/2024   DTaP/Tdap/Td vaccine (3 - Td or Tdap) 03/03/2032   Hepatitis C Screening  Completed   HIV Screening   Completed   HPV Vaccine  Aged Out     1. Wellness examination Routine HCM labs ordered. Will obtain labs today and update patient with results.  Review of PMH, FH, SH, medications and HM performed.  Recommend healthy diet.  Recommend approximately 150 minutes/week of moderate intensity exercise. Recommend regular dental and vision exams. Always use seatbelt/lap and shoulder restraints. Recommend using smoke alarms and checking batteries at least twice a year. Recommend using sunscreen when outside. Discussed immunization recommendations. Vaccines are UTD.  - CBC with Differential/Platelet - Comprehensive metabolic panel - Hemoglobin A1c - Lipid panel - TSH Rfx on Abnormal to Free T4  2. Vitamin B12 deficiency Patient has a history of vitamin B12 deficiency. She reports feeling more fatigued than normal. Will check vitamin B12 level today.  - Vitamin B12  3. Elevated blood pressure reading in office without diagnosis of hypertension Patient presents today with an acceptable blood pressure. Patient in no acute distress and is well-appearing. Denies chest pain, shortness of breath, lower extremity edema, vision changes, headaches. Cardiovascular exam with heart regular rate and rhythm. Normal heart sounds, no murmurs present. No lower extremity edema present. Lungs clear to auscultation bilaterally. Patient is not currently taking anti-hypertensive medication. Discussed low-sodium (DASH) diet, adequate water intake, and benefits of daily aerobic exercise. Advised patient to closely monitor blood pressure at home and bring monitor into office next visit to check with our machine for accuracy. Return to office sooner if blood pressure begins to increase greater than 130/80. Follow-up in 4-6 weeks.      Return in about 6 weeks (around 12/20/2022) for blood pressure f/u.     Alyson Reedy, FNP

## 2022-11-09 LAB — VITAMIN B12: Vitamin B-12: 255 pg/mL (ref 232–1245)

## 2022-11-09 LAB — CBC WITH DIFFERENTIAL/PLATELET
Basophils Absolute: 0.1 10*3/uL (ref 0.0–0.2)
Basos: 2 %
EOS (ABSOLUTE): 0.7 10*3/uL — ABNORMAL HIGH (ref 0.0–0.4)
Eos: 8 %
Hematocrit: 42.4 % (ref 34.0–46.6)
Hemoglobin: 13.9 g/dL (ref 11.1–15.9)
Immature Grans (Abs): 0 10*3/uL (ref 0.0–0.1)
Immature Granulocytes: 0 %
Lymphocytes Absolute: 3.3 10*3/uL — ABNORMAL HIGH (ref 0.7–3.1)
Lymphs: 41 %
MCH: 29.8 pg (ref 26.6–33.0)
MCHC: 32.8 g/dL (ref 31.5–35.7)
MCV: 91 fL (ref 79–97)
Monocytes Absolute: 0.5 10*3/uL (ref 0.1–0.9)
Monocytes: 7 %
Neutrophils Absolute: 3.3 10*3/uL (ref 1.4–7.0)
Neutrophils: 42 %
Platelets: 367 10*3/uL (ref 150–450)
RBC: 4.67 x10E6/uL (ref 3.77–5.28)
RDW: 12.1 % (ref 11.7–15.4)
WBC: 8 10*3/uL (ref 3.4–10.8)

## 2022-11-09 LAB — COMPREHENSIVE METABOLIC PANEL
ALT: 12 IU/L (ref 0–32)
AST: 15 IU/L (ref 0–40)
Albumin: 4.1 g/dL (ref 3.9–4.9)
Alkaline Phosphatase: 48 IU/L (ref 44–121)
BUN/Creatinine Ratio: 17 (ref 9–23)
BUN: 13 mg/dL (ref 6–20)
Bilirubin Total: 0.5 mg/dL (ref 0.0–1.2)
CO2: 21 mmol/L (ref 20–29)
Calcium: 9.6 mg/dL (ref 8.7–10.2)
Chloride: 103 mmol/L (ref 96–106)
Creatinine, Ser: 0.78 mg/dL (ref 0.57–1.00)
Globulin, Total: 3 g/dL (ref 1.5–4.5)
Glucose: 85 mg/dL (ref 70–99)
Potassium: 4.5 mmol/L (ref 3.5–5.2)
Sodium: 139 mmol/L (ref 134–144)
Total Protein: 7.1 g/dL (ref 6.0–8.5)
eGFR: 99 mL/min/{1.73_m2} (ref >59)

## 2022-11-09 LAB — LIPID PANEL
Chol/HDL Ratio: 2.8 ratio (ref 0.0–4.4)
Cholesterol, Total: 216 mg/dL — ABNORMAL HIGH (ref 100–199)
HDL: 76 mg/dL (ref >39)
LDL Chol Calc (NIH): 114 mg/dL — ABNORMAL HIGH (ref 0–99)
Triglycerides: 152 mg/dL — ABNORMAL HIGH (ref 0–149)
VLDL Cholesterol Cal: 26 mg/dL (ref 5–40)

## 2022-11-09 LAB — HEMOGLOBIN A1C
Est. average glucose Bld gHb Est-mCnc: 105 mg/dL
Hgb A1c MFr Bld: 5.3 % (ref 4.8–5.6)

## 2022-11-09 LAB — TSH RFX ON ABNORMAL TO FREE T4: TSH: 2.55 u[IU]/mL (ref 0.450–4.500)

## 2022-11-10 ENCOUNTER — Ambulatory Visit (INDEPENDENT_AMBULATORY_CARE_PROVIDER_SITE_OTHER): Payer: BC Managed Care – PPO | Admitting: Licensed Clinical Social Worker

## 2022-11-10 DIAGNOSIS — F4323 Adjustment disorder with mixed anxiety and depressed mood: Secondary | ICD-10-CM | POA: Diagnosis not present
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
## 2022-11-17 ENCOUNTER — Ambulatory Visit: Payer: BC Managed Care – PPO | Admitting: Licensed Clinical Social Worker

## 2022-11-21 ENCOUNTER — Ambulatory Visit: Payer: BC Managed Care – PPO | Admitting: Licensed Clinical Social Worker

## 2022-12-02 ENCOUNTER — Encounter (HOSPITAL_BASED_OUTPATIENT_CLINIC_OR_DEPARTMENT_OTHER): Payer: Self-pay | Admitting: Family Medicine

## 2022-12-05 ENCOUNTER — Other Ambulatory Visit (HOSPITAL_BASED_OUTPATIENT_CLINIC_OR_DEPARTMENT_OTHER): Payer: Self-pay

## 2022-12-15 ENCOUNTER — Other Ambulatory Visit (HOSPITAL_BASED_OUTPATIENT_CLINIC_OR_DEPARTMENT_OTHER): Payer: Self-pay | Admitting: *Deleted

## 2022-12-15 ENCOUNTER — Other Ambulatory Visit: Payer: Self-pay | Admitting: Adult Health

## 2022-12-15 DIAGNOSIS — Z01419 Encounter for gynecological examination (general) (routine) without abnormal findings: Secondary | ICD-10-CM

## 2022-12-15 DIAGNOSIS — F411 Generalized anxiety disorder: Secondary | ICD-10-CM

## 2022-12-15 DIAGNOSIS — Z3044 Encounter for surveillance of vaginal ring hormonal contraceptive device: Secondary | ICD-10-CM

## 2022-12-15 MED ORDER — ETONOGESTREL-ETHINYL ESTRADIOL 0.12-0.015 MG/24HR VA RING: 1.0000 | VAGINAL_RING | VAGINAL | 0 refills | Status: DC

## 2022-12-20 ENCOUNTER — Ambulatory Visit (HOSPITAL_BASED_OUTPATIENT_CLINIC_OR_DEPARTMENT_OTHER): Payer: BC Managed Care – PPO | Admitting: Family Medicine

## 2022-12-20 ENCOUNTER — Encounter (HOSPITAL_BASED_OUTPATIENT_CLINIC_OR_DEPARTMENT_OTHER): Payer: Self-pay | Admitting: Family Medicine

## 2022-12-20 VITALS — BP 123/81 | HR 74 | Ht 65.0 in | Wt 150.0 lb

## 2022-12-20 DIAGNOSIS — R030 Elevated blood-pressure reading, without diagnosis of hypertension: Secondary | ICD-10-CM

## 2022-12-20 DIAGNOSIS — F411 Generalized anxiety disorder: Secondary | ICD-10-CM | POA: Diagnosis not present

## 2022-12-20 DIAGNOSIS — J4520 Mild intermittent asthma, uncomplicated: Secondary | ICD-10-CM

## 2022-12-20 MED ORDER — FLUOXETINE HCL 20 MG PO CAPS: 20.0000 mg | ORAL_CAPSULE | Freq: Every day | ORAL | 0 refills | Status: DC

## 2022-12-20 MED ORDER — BUDESONIDE-FORMOTEROL FUMARATE 80-4.5 MCG/ACT IN AERO: 2.0000 | INHALATION_SPRAY | Freq: Two times a day (BID) | RESPIRATORY_TRACT | 12 refills | Status: DC

## 2022-12-20 NOTE — Progress Notes (Signed)
Established Patient Office Visit  Subjective   Patient ID: Laurie Young, female    DOB: 01/30/1986  Age: 37 y.o. MRN: [id number]  Laurie Young is a 37 year-old female patient who presents for elevated blood pressure.  Patient's current hypertension medication regimen is:  Patient is not regularly keeping a check on BP at home- unable to switch broken machine with new one  Adhering to low sodium diet: yes  Exercising Regularly: yes Denies headache, dizziness, CP, SHOB, vision changes.    She performed an at-home microbiome test called Viome and was notified what foods to avoid based on her genetic composition.  She is currently taking various supplements: Daily probiotic 200mg  CoQ10 Magnesium glycinate  2 capsules cordyceps  2 capsules N-acetyl cysteine  2 capsules foundational multi-vitamin   Has been taking these for a month now- does feel a little bit of a difference over the past month- improvement in fatigue. No longer eating pork, processed meats, beef.   BP Readings from Last 3 Encounters:  12/20/22 123/81  11/08/22 134/80  07/14/22 (!) 132/91   Review of Systems  Constitutional:  Negative for malaise/fatigue.  Eyes:  Negative for blurred vision and double vision.  Respiratory:  Negative for cough and shortness of breath.   Cardiovascular:  Negative for chest pain, palpitations and leg swelling.  Gastrointestinal:  Negative for abdominal pain.  Neurological:  Negative for dizziness, weakness and headaches.  Psychiatric/Behavioral:  Negative for depression and suicidal ideas. The patient is not nervous/anxious and does not have insomnia.     Objective:    BP 123/81   Pulse 74   Ht 5\' 5"  (1.651 m)   Wt 150 lb (68 kg)   SpO2 99%   BMI 24.96 kg/m  BP Readings from Last 3 Encounters:  12/20/22 123/81  11/08/22 134/80  07/14/22 (!) 132/91    Physical Exam Constitutional:      Appearance: Normal appearance.  Cardiovascular:     Rate and Rhythm: Normal  rate and regular rhythm.     Pulses: Normal pulses.     Heart sounds: Normal heart sounds.  Pulmonary:     Effort: Pulmonary effort is normal.     Breath sounds: Normal breath sounds.  Neurological:     Mental Status: She is alert.  Psychiatric:        Mood and Affect: Mood normal.        Behavior: Behavior normal.        Thought Content: Thought content normal.        Judgment: Judgment normal.     Assessment & Plan:   1. Elevated blood pressure reading in office without diagnosis of hypertension Patient presents today with well-controlled blood pressure. Patient in no acute distress and is well-appearing. Denies chest pain, shortness of breath, lower extremity edema, vision changes, headaches. Cardiovascular exam with heart regular rate and rhythm. Normal heart sounds, no murmurs present. No lower extremity edema present. Lungs clear to auscultation bilaterally. Discussed lifestyle modifications, including healthy food options, daily exercise, and stress management. Patient will continue to monitor her blood pressure at home.   2. Generalized anxiety disorder Patient needs refill of fluoxetine 20mg  daily sent to [facility]. Patient is followed by PMHNP. Able to send refill for patient.  - FLUoxetine (PROZAC) 20 MG capsule; Take 1 capsule (20 mg total) by mouth daily.  Dispense: 90 capsule; Refill: 0  3. Mild intermittent asthma without complication Patient needs refill of generic form of Symbicort sent  to [facility]. Refill sent.  - budesonide-formoterol (SYMBICORT) 80-4.5 MCG/ACT inhaler; Inhale 2 puffs into the lungs in the morning and at bedtime.  Dispense: 1 each; Refill: 12  Return in about 11 months (around 11/09/2023) for Physical with fasting labs.    Alyson Reedy, FNP

## 2022-12-29 ENCOUNTER — Telehealth (HOSPITAL_BASED_OUTPATIENT_CLINIC_OR_DEPARTMENT_OTHER): Payer: Self-pay | Admitting: Family Medicine

## 2022-12-29 ENCOUNTER — Other Ambulatory Visit (HOSPITAL_BASED_OUTPATIENT_CLINIC_OR_DEPARTMENT_OTHER): Payer: Self-pay | Admitting: Family Medicine

## 2022-12-29 DIAGNOSIS — J4520 Mild intermittent asthma, uncomplicated: Secondary | ICD-10-CM

## 2022-12-29 MED ORDER — BUDESONIDE-FORMOTEROL FUMARATE 80-4.5 MCG/ACT IN AERO: 2.0000 | INHALATION_SPRAY | Freq: Two times a day (BID) | RESPIRATORY_TRACT | 12 refills | Status: DC

## 2022-12-29 NOTE — Telephone Encounter (Signed)
[facility] calling symbicort is covered and not the generic budesonide. Can you reorder the medication for this patient? [phone]

## 2023-03-23 ENCOUNTER — Ambulatory Visit (HOSPITAL_BASED_OUTPATIENT_CLINIC_OR_DEPARTMENT_OTHER): Payer: BC Managed Care – PPO | Admitting: Obstetrics & Gynecology

## 2023-03-23 ENCOUNTER — Encounter (HOSPITAL_BASED_OUTPATIENT_CLINIC_OR_DEPARTMENT_OTHER): Payer: Self-pay | Admitting: Family Medicine

## 2023-03-25 ENCOUNTER — Other Ambulatory Visit: Payer: Self-pay | Admitting: Adult Health

## 2023-03-25 DIAGNOSIS — F411 Generalized anxiety disorder: Secondary | ICD-10-CM

## 2023-05-27 ENCOUNTER — Other Ambulatory Visit (HOSPITAL_BASED_OUTPATIENT_CLINIC_OR_DEPARTMENT_OTHER): Payer: Self-pay | Admitting: Family Medicine

## 2023-05-27 DIAGNOSIS — F411 Generalized anxiety disorder: Secondary | ICD-10-CM

## 2023-06-12 ENCOUNTER — Encounter (HOSPITAL_BASED_OUTPATIENT_CLINIC_OR_DEPARTMENT_OTHER): Payer: Self-pay | Admitting: Obstetrics & Gynecology

## 2023-06-12 ENCOUNTER — Other Ambulatory Visit (HOSPITAL_COMMUNITY)
Admission: RE | Admit: 2023-06-12 | Discharge: 2023-06-12 | Disposition: A | Discharge status: Home / Self Care | Payer: BC Managed Care – PPO | Source: Ambulatory Visit | Attending: Obstetrics & Gynecology | Admitting: Obstetrics & Gynecology

## 2023-06-12 ENCOUNTER — Ambulatory Visit (INDEPENDENT_AMBULATORY_CARE_PROVIDER_SITE_OTHER): Payer: BC Managed Care – PPO | Admitting: Obstetrics & Gynecology

## 2023-06-12 VITALS — BP 121/65 | HR 69 | Ht 65.0 in | Wt 148.2 lb

## 2023-06-12 DIAGNOSIS — Z124 Encounter for screening for malignant neoplasm of cervix: Secondary | ICD-10-CM

## 2023-06-12 DIAGNOSIS — Z3141 Encounter for fertility testing: Secondary | ICD-10-CM | POA: Diagnosis not present

## 2023-06-12 DIAGNOSIS — Z01419 Encounter for gynecological examination (general) (routine) without abnormal findings: Secondary | ICD-10-CM | POA: Diagnosis not present

## 2023-06-12 DIAGNOSIS — F411 Generalized anxiety disorder: Secondary | ICD-10-CM

## 2023-06-12 DIAGNOSIS — Z1231 Encounter for screening mammogram for malignant neoplasm of breast: Secondary | ICD-10-CM

## 2023-06-12 DIAGNOSIS — Z3044 Encounter for surveillance of vaginal ring hormonal contraceptive device: Secondary | ICD-10-CM | POA: Diagnosis not present

## 2023-06-12 DIAGNOSIS — N946 Dysmenorrhea, unspecified: Secondary | ICD-10-CM | POA: Diagnosis not present

## 2023-06-12 MED ORDER — FLUOXETINE HCL 20 MG PO CAPS: 20.0000 mg | ORAL_CAPSULE | Freq: Every day | ORAL | 3 refills | Status: AC

## 2023-06-12 MED ORDER — ETONOGESTREL-ETHINYL ESTRADIOL 0.12-0.015 MG/24HR VA RING: VAGINAL_RING | VAGINAL | 4 refills | Status: DC

## 2023-06-12 MED ORDER — HYDROXYZINE HCL 25 MG PO TABS: 25.0000 mg | ORAL_TABLET | Freq: Every evening | ORAL | 3 refills | Status: DC | PRN

## 2023-06-12 NOTE — Progress Notes (Signed)
 38 y.o. G0P0000 [name] here for annual exam.  Stopped her nuva ring.  Had issue with prescription.  Cycles are not as predictable.  She does have more menstrual associated symptoms so wants to restart.  She did have some hot flashes when she stopped the nuva ring.  This has seems to have resolved.    Has questions about fertility testing and possibly egg freezing.  Questions answered.  Will check [facility] today.  Patient's last menstrual period was 06/02/2023 (approximate).          Sexually active: No.  The current method of family planning is abstinence.    Smoker:  no  Health Maintenance: Pap:  03/04/2021; ASC-US  History of abnormal Pap:  no MMG:  guidelines reviewed Colonoscopy:  guidelines reviewed Screening Labs: done in July, 2024   reports that she has never smoked. She has never been exposed to tobacco smoke. She has never used smokeless tobacco. She reports current alcohol use. She reports that she does not use drugs.  Past Medical History:  Diagnosis Date   Allergies    Anxiety    Elevated blood pressure reading in office without diagnosis of hypertension 07/15/2022   Exercise-induced asthma    Mental disorder    Panic attacks     No past surgical history on file.  Current Outpatient Medications  Medication Sig Dispense Refill   ALPRAZolam  (XANAX ) 0.25 MG tablet Take 1 tablet (0.25 mg total) by mouth as needed for anxiety. 30 tablet 2   budesonide -formoterol  (SYMBICORT ) 80-4.5 MCG/ACT inhaler Inhale 2 puffs into the lungs in the morning and at bedtime. 1 each 12   FLUoxetine  (PROZAC ) 20 MG capsule Take 1 capsule (20 mg total) by mouth daily. 90 capsule 0   hydrOXYzine  (ATARAX ) 25 MG tablet TAKE 1 TABLET BY MOUTH AT BEDTIME AS NEEDED. 90 tablet 0   montelukast  (SINGULAIR ) 10 MG tablet Take 10 mg by mouth daily.     etonogestrel -ethinyl estradiol  (NUVARING ) 0.12-0.015 MG/24HR vaginal ring Place 1 ring vaginally every 28 (twenty-eight) days. Insert  vaginally and leave in place for 3 consecutive weeks, then remove for 1 week. (Patient not taking: Reported on 06/12/2023) 3 each 0   No current facility-administered medications for this visit.    Family History  Problem Relation Age of Onset   Stroke Mother    Anxiety disorder Mother    Depression Mother     ROS: Constitutional: negative Genitourinary:negative  Exam:   BP 121/65 (BP Location: Left Arm, Patient Position: Sitting, Cuff Size: Large)   Pulse 69   Ht 5\' 5"  (1.651 m)   Wt 148 lb 3.2 oz (67.2 kg)   LMP 06/02/2023 (Approximate)   BMI 24.66 kg/m   Height: 5\' 5"  (165.1 cm)  General appearance: alert, cooperative and appears stated age Head: Normocephalic, without obvious abnormality, atraumatic Neck: no adenopathy, supple, symmetrical, trachea midline and thyroid normal to inspection and palpation Lungs: clear to auscultation bilaterally Breasts: normal appearance, no masses or tenderness Heart: regular rate and rhythm Abdomen: soft, non-tender; bowel sounds normal; no masses,  no organomegaly Extremities: extremities normal, atraumatic, no cyanosis or edema Skin: Skin color, texture, turgor normal. No rashes or lesions Lymph nodes: Cervical, supraclavicular, and axillary nodes normal. No abnormal inguinal nodes palpated Neurologic: Grossly normal   Pelvic: External genitalia:  no lesions              Urethra:  normal appearing urethra with no masses, tenderness or lesions  Bartholins and Skenes: normal                 Vagina: normal appearing vagina with normal color and no discharge, no lesions              Cervix: no lesions              Pap taken: Yes.   Bimanual Exam:  Uterus:  normal size, contour, position, consistency, mobility, non-tender              Adnexa: normal adnexa and no mass, fullness, tenderness               Rectovaginal: Confirms               Anus:  normal sphincter tone, no lesions  Chaperone, Arnetta Bianchi, CMA, was present  for exam.  Assessment/Plan: 1. Well woman exam with routine gynecological exam (Primary) - Pap smear obtained today with HR HPV - Mammogram ordered due to upcoming birthday - Colonoscopy guidelines - lab work done with PCP in July, 2024 - vaccines reviewed/updated  2. Cervical cancer screening - Cytology - PAP( [facility])  3. Menstrual cramps - will restart Nuva ring - etonogestrel -ethinyl estradiol  (NUVARING ) 0.12-0.015 MG/24HR vaginal ring; Insert vaginally and leave in place for 3 consecutive weeks, then remove for 1 week.  Dispense: 3 each; Refill: 4  5. Generalized anxiety disorder - hydrOXYzine  (ATARAX ) 25 MG tablet; Take 1 tablet (25 mg total) by mouth at bedtime as needed.  Dispense: 90 tablet; Refill: 3 - FLUoxetine  (PROZAC ) 20 MG capsule; Take 1 capsule (20 mg total) by mouth daily.  Dispense: 90 capsule; Refill: 3  6. Encounter for screening mammogram for malignant neoplasm of breast - MM 3D SCREENING MAMMOGRAM BILATERAL BREAST; Future  7. Fertility testing - Anti mullerian hormone

## 2023-06-16 ENCOUNTER — Other Ambulatory Visit (HOSPITAL_BASED_OUTPATIENT_CLINIC_OR_DEPARTMENT_OTHER): Payer: Self-pay

## 2023-06-16 DIAGNOSIS — N946 Dysmenorrhea, unspecified: Secondary | ICD-10-CM

## 2023-06-16 LAB — ANTI MULLERIAN HORMONE: ANTI-MULLERIAN HORMONE (AMH): 2.14 ng/mL

## 2023-06-16 MED ORDER — NUVARING 0.12-0.015 MG/24HR VA RING: VAGINAL_RING | VAGINAL | 4 refills | Status: DC

## 2023-06-17 LAB — CYTOLOGY - PAP
Comment: NEGATIVE
Diagnosis: NEGATIVE
High risk HPV: NEGATIVE

## 2023-06-19 ENCOUNTER — Encounter (HOSPITAL_BASED_OUTPATIENT_CLINIC_OR_DEPARTMENT_OTHER): Payer: Self-pay | Admitting: Obstetrics & Gynecology

## 2023-06-20 ENCOUNTER — Ambulatory Visit (HOSPITAL_BASED_OUTPATIENT_CLINIC_OR_DEPARTMENT_OTHER)
Admission: RE | Admit: 2023-06-20 | Discharge: 2023-06-20 | Disposition: A | Discharge status: Home / Self Care | Payer: BC Managed Care – PPO | Source: Ambulatory Visit | Attending: Obstetrics & Gynecology | Admitting: Obstetrics & Gynecology

## 2023-06-20 ENCOUNTER — Encounter (HOSPITAL_BASED_OUTPATIENT_CLINIC_OR_DEPARTMENT_OTHER): Payer: Self-pay | Admitting: Radiology

## 2023-06-20 DIAGNOSIS — Z1231 Encounter for screening mammogram for malignant neoplasm of breast: Secondary | ICD-10-CM | POA: Diagnosis not present

## 2023-07-10 ENCOUNTER — Other Ambulatory Visit (HOSPITAL_BASED_OUTPATIENT_CLINIC_OR_DEPARTMENT_OTHER): Payer: Self-pay | Admitting: Family Medicine

## 2023-07-10 DIAGNOSIS — J4520 Mild intermittent asthma, uncomplicated: Secondary | ICD-10-CM

## 2023-07-10 MED ORDER — BUDESONIDE-FORMOTEROL FUMARATE 80-4.5 MCG/ACT IN AERO: 2.0000 | INHALATION_SPRAY | Freq: Two times a day (BID) | RESPIRATORY_TRACT | 5 refills | Status: DC

## 2023-07-10 MED ORDER — MONTELUKAST SODIUM 10 MG PO TABS: 10.0000 mg | ORAL_TABLET | Freq: Every day | ORAL | 3 refills | Status: AC

## 2023-07-10 NOTE — Progress Notes (Signed)
 [facility] Refill request received by fax. Medications and chart reviewed and refills sent.

## 2023-07-31 ENCOUNTER — Encounter: Payer: Self-pay | Admitting: Family Medicine

## 2023-07-31 ENCOUNTER — Encounter (HOSPITAL_BASED_OUTPATIENT_CLINIC_OR_DEPARTMENT_OTHER): Payer: Self-pay | Admitting: Family Medicine

## 2023-07-31 ENCOUNTER — Encounter (HOSPITAL_BASED_OUTPATIENT_CLINIC_OR_DEPARTMENT_OTHER): Payer: Self-pay | Admitting: Obstetrics & Gynecology

## 2023-08-01 ENCOUNTER — Other Ambulatory Visit (HOSPITAL_BASED_OUTPATIENT_CLINIC_OR_DEPARTMENT_OTHER): Payer: Self-pay | Admitting: Family Medicine

## 2023-08-01 DIAGNOSIS — J4520 Mild intermittent asthma, uncomplicated: Secondary | ICD-10-CM

## 2023-08-01 MED ORDER — BUDESONIDE-FORMOTEROL FUMARATE 160-4.5 MCG/ACT IN AERO: 2.0000 | INHALATION_SPRAY | Freq: Two times a day (BID) | RESPIRATORY_TRACT | 5 refills | Status: AC

## 2023-08-01 NOTE — Telephone Encounter (Signed)
 Pt had Rx for Symbicort 80 sent to [facility] for her. Pt states that was the wrong dose that was sent in. Jon Gills, please see mychart messages sent by pt and advise.

## 2023-08-29 ENCOUNTER — Ambulatory Visit (HOSPITAL_BASED_OUTPATIENT_CLINIC_OR_DEPARTMENT_OTHER): Admitting: Family Medicine

## 2023-08-29 ENCOUNTER — Other Ambulatory Visit (HOSPITAL_BASED_OUTPATIENT_CLINIC_OR_DEPARTMENT_OTHER): Payer: Self-pay | Admitting: Family Medicine

## 2023-08-29 ENCOUNTER — Encounter (HOSPITAL_BASED_OUTPATIENT_CLINIC_OR_DEPARTMENT_OTHER): Payer: Self-pay | Admitting: Family Medicine

## 2023-08-29 VITALS — BP 124/71 | HR 83 | Ht 65.0 in | Wt 150.8 lb

## 2023-08-29 DIAGNOSIS — T148XXA Other injury of unspecified body region, initial encounter: Secondary | ICD-10-CM | POA: Diagnosis not present

## 2023-08-29 DIAGNOSIS — E663 Overweight: Secondary | ICD-10-CM | POA: Diagnosis not present

## 2023-08-29 DIAGNOSIS — Z0000 Encounter for general adult medical examination without abnormal findings: Secondary | ICD-10-CM

## 2023-08-29 DIAGNOSIS — Z13220 Encounter for screening for lipoid disorders: Secondary | ICD-10-CM

## 2023-08-29 MED ORDER — PHENTERMINE HCL 37.5 MG PO TABS: 37.5000 mg | ORAL_TABLET | Freq: Every day | ORAL | 2 refills | Status: AC

## 2023-08-29 NOTE — Progress Notes (Signed)
 Subjective:   Laurie Young [birth year] 08/29/2023  Chief Complaint  Patient presents with   Medication Management    Pt wants to talk about possibly being started on weight loss medication.    HPI: Laurie Young presents today for re-assessment and management of chronic medical conditions.  WEIGHT MANAGEMENT: Laurie Young presents for weight management. She would like to trial weight loss medication after trying diet, exercise without improvement for the past several years. She has noticed with her weight gain that her asthma is worsening. She has a hx of elevated cholesterol. She states her average weight was 130 for several years. Reports her sister is prediabetic and her mother has hx CVA. No family hx of thyroid disease or Type 2 DM.   Current dietary plan: Decreased portion size, low intake of carbohydrates/sugars. 1-2 meals per day per patient.  Regular exercise regimen: Works out with Pilates, Treadmill and weight lifting regularly  Medications tried in the past: None   Wt Readings from Last 3 Encounters:  08/29/23 150 lb 12.8 oz (68.4 kg)  06/12/23 148 lb 3.2 oz (67.2 kg)  12/20/22 150 lb (68 kg)   Lab Results  Component Value Date   HGBA1C 5.3 11/08/2022     BRUISING:  Patient reports hx of easy bruising for several years. She recently noticed diffuse bruising present to bilateral posterior upper extremities in the past weeks during exercise. Patient plays golf, exercises frequently. Denies easy bleeding, blood in stools or vomiting. Denies family hx of coagulopathies.   The following portions of the patient's history were reviewed and updated as appropriate: past medical history, past surgical history, family history, social history, allergies, medications, and problem list.   Patient Active Problem List   Diagnosis Date Noted   Mild intermittent asthma without complication 07/15/2022   Vitamin B12 deficiency 07/15/2022   Anxiety and depression  07/15/2022   Encounter to establish care with new doctor 07/15/2022   Elevated blood pressure reading in office without diagnosis of hypertension 07/15/2022   Exercise-induced asthma 03/07/2021   Encounter for well woman exam with routine gynecological exam 02/08/2019   Encounter for initial prescription of vaginal ring hormonal contraceptive 02/08/2019   Past Medical History:  Diagnosis Date   Allergies    Anxiety    Elevated blood pressure reading in office without diagnosis of hypertension 07/15/2022   Exercise-induced asthma    Mental disorder    Panic attacks    History reviewed. No pertinent surgical history. Family History  Problem Relation Age of Onset   Stroke Mother    Anxiety disorder Mother    Depression Mother    Outpatient Medications Prior to Visit  Medication Sig Dispense Refill   ALPRAZolam  (XANAX ) 0.25 MG tablet Take 1 tablet (0.25 mg total) by mouth as needed for anxiety. 30 tablet 2   budesonide -formoterol  (SYMBICORT ) 160-4.5 MCG/ACT inhaler Inhale 2 puffs into the lungs 2 (two) times daily. 1 each 5   FLUoxetine  (PROZAC ) 20 MG capsule Take 1 capsule (20 mg total) by mouth daily. 90 capsule 3   hydrOXYzine  (ATARAX ) 25 MG tablet Take 1 tablet (25 mg total) by mouth at bedtime as needed. 90 tablet 3   montelukast  (SINGULAIR ) 10 MG tablet Take 1 tablet (10 mg total) by mouth daily. 90 tablet 3   NUVARING  0.12-0.015 MG/24HR vaginal ring Insert vaginally and leave in place for 3 consecutive weeks, then remove for 1 week. 3 each 4   No facility-administered medications prior to  visit.   Allergies  Allergen Reactions   Penicillins    Other     CATS     ROS: A complete ROS was performed with pertinent positives/negatives noted in the HPI. The remainder of the ROS are negative.    Objective:   Today's Vitals   08/29/23 0845  BP: 124/71  Pulse: 83  SpO2: 100%  Weight: 150 lb 12.8 oz (68.4 kg)  Height: 5\' 5"  (1.651 m)    Physical Exam           GENERAL: Well-appearing, in NAD. Well nourished.  SKIN: Pink, warm and dry. Mild ecchymosis healing present to bilateral upper extremities.   Head: Normocephalic. NECK: Trachea midline. Full ROM w/o pain or tenderness.  RESPIRATORY: Chest wall symmetrical. Respirations even and non-labored. Breath sounds clear to auscultation bilaterally.  CARDIAC: S1, S2 present, regular rate and rhythm without murmur or gallops. Peripheral pulses 2+ bilaterally.  MSK: Muscle tone and strength appropriate for age. Joints w/o tenderness, redness, or swelling.  EXTREMITIES: Without clubbing, cyanosis, or edema.  NEUROLOGIC: No motor or sensory deficits. Steady, even gait. C2-C12 intact.  PSYCH/MENTAL STATUS: Alert, oriented x 3. Cooperative, appropriate mood and affect.   EKG 08/29/2022: normal sinus rhythm, nonspecific ST abnormality. There are no previous tracings available for comparison.     Assessment & Plan:  1. Overweight (BMI 25.0-29.9) (Primary) Discussed BMI of 25.09 and recommendations for dietary changes, exercise which patient is currently following. I believe she would be a better candidate for short term use of phentermine instead of GLP1 and patient was agreeable. Patient to monitor HR, BP. Safe use and possible side effects reviewed with patient. EKG reviewed and unremarkable. Will assess thyroid function with labs and electrolytes as well prior to starting.  - TSH Rfx on Abnormal to Free T4  2. Bruising No signs of acute or gross bleeding. Will obtain labs to assess for possible anemia, coagulopathies with labs today.  - CBC with Differential/Platelet - Basic metabolic panel with GFR - PT and PTT   Meds ordered this encounter  Medications   phentermine (ADIPEX-P) 37.5 MG tablet    Sig: Take 1 tablet (37.5 mg total) by mouth daily before breakfast.    Dispense:  30 tablet    Refill:  2    Supervising Provider:   DE [place], RAYMOND J [[id number]]   Lab Orders         TSH Rfx on  Abnormal to Free T4         CBC with Differential/Platelet         Basic metabolic panel with GFR         PT and PTT     No images are attached to the encounter or orders placed in the encounter.  Return in about 3 months (around 11/28/2023) for ANNUAL PHYSICAL (fasting labs 1 week prior) .    Patient to reach out to office if new, worrisome, or unresolved symptoms arise or if no improvement in patient's condition. Patient verbalized understanding and is agreeable to treatment plan. All questions answered to patient's satisfaction.    Nonda Bays, [place]

## 2023-08-30 ENCOUNTER — Encounter (HOSPITAL_BASED_OUTPATIENT_CLINIC_OR_DEPARTMENT_OTHER): Payer: Self-pay | Admitting: Family Medicine

## 2023-08-30 LAB — BASIC METABOLIC PANEL WITH GFR
BUN/Creatinine Ratio: 18 (ref 9–23)
BUN: 14 mg/dL (ref 6–24)
CO2: 20 mmol/L (ref 20–29)
Calcium: 8.9 mg/dL (ref 8.7–10.2)
Chloride: 106 mmol/L (ref 96–106)
Creatinine, Ser: 0.78 mg/dL (ref 0.57–1.00)
Glucose: 76 mg/dL (ref 70–99)
Potassium: 4.3 mmol/L (ref 3.5–5.2)
Sodium: 139 mmol/L (ref 134–144)
eGFR: 98 mL/min/{1.73_m2} (ref >59)

## 2023-08-30 LAB — CBC WITH DIFFERENTIAL/PLATELET
Basophils Absolute: 0.1 10*3/uL (ref 0.0–0.2)
Basos: 2 %
EOS (ABSOLUTE): 0.6 10*3/uL — ABNORMAL HIGH (ref 0.0–0.4)
Eos: 7 %
Hematocrit: 41.4 % (ref 34.0–46.6)
Hemoglobin: 13.5 g/dL (ref 11.1–15.9)
Immature Grans (Abs): 0 10*3/uL (ref 0.0–0.1)
Immature Granulocytes: 0 %
Lymphocytes Absolute: 2.9 10*3/uL (ref 0.7–3.1)
Lymphs: 33 %
MCH: 30.6 pg (ref 26.6–33.0)
MCHC: 32.6 g/dL (ref 31.5–35.7)
MCV: 94 fL (ref 79–97)
Monocytes Absolute: 0.5 10*3/uL (ref 0.1–0.9)
Monocytes: 6 %
Neutrophils Absolute: 4.5 10*3/uL (ref 1.4–7.0)
Neutrophils: 52 %
Platelets: 350 10*3/uL (ref 150–450)
RBC: 4.41 x10E6/uL (ref 3.77–5.28)
RDW: 12.1 % (ref 11.7–15.4)
WBC: 8.7 10*3/uL (ref 3.4–10.8)

## 2023-08-30 LAB — TSH RFX ON ABNORMAL TO FREE T4: TSH: 2.14 u[IU]/mL (ref 0.450–4.500)

## 2023-08-30 LAB — PT AND PTT
INR: 1 (ref 0.9–1.2)
Prothrombin Time: 10.9 s (ref 9.1–12.0)
aPTT: 26 s (ref 24–33)

## 2023-08-30 NOTE — Progress Notes (Signed)
 Hi Laurie Young, Your blood counts are unremarkable for any contributing factors to easy bleeding or bruising.  Your eosinophil count was slightly elevated likely due to allergies and asthma.  This was not at a level to be overly concerned about at this time.  Your thyroid function, electrolytes and kidney function were normal.  Your clotting factors were also within a normal limit.  If you have any further questions please let me know

## 2023-10-04 ENCOUNTER — Encounter (HOSPITAL_BASED_OUTPATIENT_CLINIC_OR_DEPARTMENT_OTHER): Payer: Self-pay | Admitting: Family Medicine

## 2023-10-04 NOTE — Telephone Encounter (Signed)
 Please see mychart message sent by pt and advise.

## 2024-02-19 ENCOUNTER — Encounter (HOSPITAL_BASED_OUTPATIENT_CLINIC_OR_DEPARTMENT_OTHER): Payer: Self-pay | Admitting: Obstetrics & Gynecology

## 2024-02-19 ENCOUNTER — Other Ambulatory Visit (HOSPITAL_BASED_OUTPATIENT_CLINIC_OR_DEPARTMENT_OTHER): Payer: Self-pay

## 2024-02-19 MED ORDER — ETONOGESTREL-ETHINYL ESTRADIOL 0.12-0.015 MG/24HR VA RING: VAGINAL_RING | VAGINAL | 12 refills | Status: DC

## 2024-02-20 ENCOUNTER — Other Ambulatory Visit (HOSPITAL_BASED_OUTPATIENT_CLINIC_OR_DEPARTMENT_OTHER): Payer: Self-pay | Admitting: Obstetrics & Gynecology

## 2024-03-15 ENCOUNTER — Encounter (HOSPITAL_BASED_OUTPATIENT_CLINIC_OR_DEPARTMENT_OTHER): Admitting: Family Medicine

## 2024-05-08 ENCOUNTER — Other Ambulatory Visit (HOSPITAL_BASED_OUTPATIENT_CLINIC_OR_DEPARTMENT_OTHER): Payer: Self-pay | Admitting: Obstetrics & Gynecology

## 2024-05-08 DIAGNOSIS — F411 Generalized anxiety disorder: Secondary | ICD-10-CM

## 2024-05-09 ENCOUNTER — Encounter (HOSPITAL_BASED_OUTPATIENT_CLINIC_OR_DEPARTMENT_OTHER): Admitting: Family Medicine

## 2024-06-04 ENCOUNTER — Encounter (HOSPITAL_BASED_OUTPATIENT_CLINIC_OR_DEPARTMENT_OTHER): Admitting: Family Medicine

## 2024-06-05 ENCOUNTER — Encounter (HOSPITAL_BASED_OUTPATIENT_CLINIC_OR_DEPARTMENT_OTHER): Admitting: Family Medicine

## 2024-07-16 ENCOUNTER — Encounter (HOSPITAL_BASED_OUTPATIENT_CLINIC_OR_DEPARTMENT_OTHER): Admitting: Family Medicine
